# Patient Record
Sex: Female | Born: 1995 | Marital: Single | Smoking: Never smoker
Health system: Southern US, Community
[De-identification: ages and names within clinical notes are randomized; demographics above are authoritative.]

## PROBLEM LIST (undated history)

## (undated) DIAGNOSIS — R569 Unspecified convulsions: Secondary | ICD-10-CM

## (undated) HISTORY — DX: Unspecified convulsions: R56.9

---

## 2019-12-18 ENCOUNTER — Ambulatory Visit: Payer: Medicaid Other

## 2019-12-18 ENCOUNTER — Telehealth: Payer: Self-pay

## 2019-12-18 NOTE — Telephone Encounter (Signed)
VM full; Unable to reach pt for NOB virtual nurse visit

## 2019-12-26 DIAGNOSIS — O0990 Supervision of high risk pregnancy, unspecified, unspecified trimester: Secondary | ICD-10-CM | POA: Insufficient documentation

## 2019-12-27 ENCOUNTER — Other Ambulatory Visit (HOSPITAL_COMMUNITY)
Admission: RE | Admit: 2019-12-27 | Discharge: 2019-12-27 | Disposition: A | Discharge status: Home / Self Care | Payer: Medicaid Other | Source: Ambulatory Visit | Attending: Obstetrics | Admitting: Obstetrics

## 2019-12-27 ENCOUNTER — Other Ambulatory Visit: Payer: Self-pay | Admitting: Obstetrics

## 2019-12-27 ENCOUNTER — Encounter: Payer: Self-pay | Admitting: Obstetrics

## 2019-12-27 ENCOUNTER — Other Ambulatory Visit: Payer: Self-pay

## 2019-12-27 ENCOUNTER — Ambulatory Visit (INDEPENDENT_AMBULATORY_CARE_PROVIDER_SITE_OTHER): Payer: Medicaid Other | Admitting: Obstetrics

## 2019-12-27 VITALS — BP 132/76 | Ht 66.0 in | Wt 131.0 lb

## 2019-12-27 DIAGNOSIS — Z3492 Encounter for supervision of normal pregnancy, unspecified, second trimester: Secondary | ICD-10-CM

## 2019-12-27 DIAGNOSIS — Z3490 Encounter for supervision of normal pregnancy, unspecified, unspecified trimester: Secondary | ICD-10-CM

## 2019-12-27 DIAGNOSIS — Z3A13 13 weeks gestation of pregnancy: Secondary | ICD-10-CM | POA: Diagnosis not present

## 2019-12-27 MED ORDER — BLOOD PRESSURE KIT DEVI: 1.0000 | 0 refills | Status: DC

## 2019-12-27 MED ORDER — PRENATE PIXIE 10-0.6-0.4-200 MG PO CAPS: 1.0000 | ORAL_CAPSULE | Freq: Every day | ORAL | 3 refills | Status: DC

## 2019-12-27 NOTE — Addendum Note (Signed)
Addended by: Brock Bad on: 12/27/2019 03:18 PM   Modules accepted: Orders

## 2019-12-27 NOTE — Progress Notes (Signed)
Subjective:    Catherine Luna is being seen today for her first obstetrical visit.  This is not a planned pregnancy. She is at [redacted]w[redacted]d gestation. Her obstetrical history is significant for none. Relationship with FOB: significant other, not living together. Patient does intend to breast feed. Pregnancy history fully reviewed.  The information documented in the HPI was reviewed and verified.  Menstrual History: OB History    Gravida  1   Para  0   Term      Preterm      AB      Living        SAB      IAB      Ectopic      Multiple      Live Births               Patient's last menstrual period was 09/21/2019.    Past Medical History:  Diagnosis Date  . Seizures ([place])     History reviewed. No pertinent surgical history.  (Not in a hospital admission)  No Known Allergies  Social History   Tobacco Use  . Smoking status: Never Smoker  . Smokeless tobacco: Never Used  Substance Use Topics  . Alcohol use: Not Currently    Family History  Problem Relation Age of Onset  . Alcohol abuse Mother   . Heart disease Mother   . Hypertension Mother   . Anxiety disorder Sister   . Depression Sister   . Asthma Brother   . Heart disease Maternal Grandfather      Review of Systems Constitutional: negative for weight loss Gastrointestinal: negative for vomiting Genitourinary:negative for genital lesions and vaginal discharge and dysuria Musculoskeletal:negative for back pain Behavioral/Psych: negative for abusive relationship, depression, illegal drug usage and tobacco use    Objective:    BP 132/76   Ht $R'5\' 6"'ZH$  (1.676 m)   Wt 131 lb (59.4 kg)   LMP 09/21/2019   BMI 21.14 kg/m  General Appearance:    Alert, cooperative, no distress, appears stated age  Head:    Normocephalic, without obvious abnormality, atraumatic  Eyes:    PERRL, conjunctiva/corneas clear, EOM's intact, fundi    benign, both eyes  Ears:    Normal TM's and external ear canals, both ears   Nose:   Nares normal, septum midline, mucosa normal, no drainage    or sinus tenderness  Throat:   Lips, mucosa, and tongue normal; teeth and gums normal  Neck:   Supple, symmetrical, trachea midline, no adenopathy;    thyroid:  no enlargement/tenderness/nodules; no carotid   bruit or JVD  Back:     Symmetric, no curvature, ROM normal, no CVA tenderness  Lungs:     Clear to auscultation bilaterally, respirations unlabored  Chest Wall:    No tenderness or deformity   Heart:    Regular rate and rhythm, S1 and S2 normal, no murmur, rub   or gallop  Breast Exam:    No tenderness, masses, or nipple abnormality  Abdomen:     Soft, non-tender, bowel sounds active all four quadrants,    no masses, no organomegaly  Genitalia:    Normal female without lesion, discharge or tenderness  Extremities:   Extremities normal, atraumatic, no cyanosis or edema  Pulses:   2+ and symmetric all extremities  Skin:   Skin color, texture, turgor normal, no rashes or lesions  Lymph nodes:   Cervical, supraclavicular, and axillary nodes normal  Neurologic:   CNII-XII intact,  normal strength, sensation and reflexes    throughout      Lab Review Urine pregnancy test Labs reviewed yes Radiologic studies reviewed yes  Assessment:    Pregnancy at [redacted]w[redacted]d weeks    Plan:     1. Encounter for supervision of normal pregnancy, antepartum, unspecified gravidity Rx: - Blood Pressure Monitoring (BLOOD PRESSURE KIT) DEVI; 1 kit by Does not apply route once a week. Check Blood Pressure regularly and record readings into the Babyscripts App.  Large Cuff.  DX O90.0  Dispense: 1 each; Refill: 0 - Enroll Patient in Babyscripts - Babyscripts Schedule Optimization - Culture, OB Urine - Genetic Screening - CBC/D/Plt+RPR+Rh+ABO+Rub Ab... - Cytology - PAP( [place]) - Cervicovaginal ancillary only( [place]) - Flu Vaccine QUAD 36+ mos IM (Fluarix, Quad PF) - [place] MFM OB COMP + 14 WK; Future   Prenatal vitamins.   Counseling provided regarding continued use of seat belts, cessation of alcohol consumption, smoking or use of illicit drugs; infection precautions i.e., influenza/TDAP immunizations, toxoplasmosis,CMV, parvovirus, listeria and varicella; workplace safety, exercise during pregnancy; routine dental care, safe medications, sexual activity, hot tubs, saunas, pools, travel, caffeine use, fish and methlymercury, potential toxins, hair treatments, varicose veins Weight gain recommendations per IOM guidelines reviewed: underweight/BMI< 18.5--> gain 28 - 40 lbs; normal weight/BMI 18.5 - 24.9--> gain 25 - 35 lbs; overweight/BMI 25 - 29.9--> gain 15 - 25 lbs; obese/BMI >30->gain  11 - 20 lbs Problem list reviewed and updated. FIRST/CF mutation testing/NIPT/QUAD SCREEN/fragile X/Ashkenazi Jewish population testing/Spinal muscular atrophy discussed: requested. Role of ultrasound in pregnancy discussed; fetal survey: requested. Amniocentesis discussed: not indicated.  Meds ordered this encounter  Medications  . Blood Pressure Monitoring (BLOOD PRESSURE KIT) DEVI    Sig: 1 kit by Does not apply route once a week. Check Blood Pressure regularly and record readings into the Babyscripts App.  Large Cuff.  DX O90.0    Dispense:  1 each    Refill:  0   Orders Placed This Encounter  Procedures  . Culture, OB Urine  . [place] MFM OB COMP + 14 WK    Standing Status:   Future    Standing Expiration Date:   12/25/2020    Order Specific Question:   Reason for Exam (SYMPTOM  OR DIAGNOSIS REQUIRED)    Answer:   Anatomy    Order Specific Question:   Preferred Location    Answer:   [facility] Ultrasound  . Genetic Screening  . CBC/D/Plt+RPR+Rh+ABO+Rub Ab...    Follow up in 4 weeks. 50% of 20 min visit spent on counseling and coordination of care.    Shelly Bombard, MD 12/27/2019 2:55 PM

## 2019-12-27 NOTE — Patient Instructions (Signed)
Heartburn During Pregnancy Heartburn is a type of pain or discomfort that can happen in the throat or chest. It is often described as a burning sensation. Heartburn is common during pregnancy because:  A hormone (progesterone) that is released during pregnancy may relax the valve (lower esophageal sphincter, or LES) that separates the esophagus from the stomach. This allows stomach acid to move up into the esophagus, causing heartburn.  The uterus gets larger and pushes up on the stomach, which pushes more acid into the esophagus. This is especially true in the later stages of pregnancy. Heartburn usually goes away or gets better after giving birth. What are the causes? Heartburn is caused by stomach acid backing up into the esophagus (reflux). Reflux can be triggered by:  Changing hormone levels.  Large meals.  Certain foods and beverages, such as coffee, chocolate, onions, and peppermint.  Exercise.  Increased stomach acid production. What increases the risk? You are more likely to experience heartburn during pregnancy if you:  Had heartburn prior to becoming pregnant.  Have been pregnant more than once before.  Are overweight or obese. The likelihood that you will get heartburn also increases as you get farther along in your pregnancy, especially during the last trimester. What are the signs or symptoms? Symptoms of this condition include:  Burning pain in the chest or lower throat.  Bitter taste in the mouth.  Coughing.  Problems swallowing.  Vomiting.  Hoarse voice.  Asthma. Symptoms may get worse when you lie down or bend over. Symptoms are often worse at night. How is this diagnosed? This condition is diagnosed based on:  Your medical history.  Your symptoms.  Blood tests to check for a certain type of bacteria associated with heartburn.  Whether taking heartburn medicine relieves your symptoms.  Examination of the stomach and esophagus using a tube with  a light and camera on the end (endoscopy). How is this treated? Treatment varies depending on how severe your symptoms are. Your health care provider may recommend:  Over-the-counter medicines (antacids or acid reducers) for mild heartburn.  Prescription medicines to decrease stomach acid or to protect your stomach lining.  Certain changes in your diet.  Raising the head of your bed so it is higher than the foot of the bed. This helps prevent stomach acid from backing up into the esophagus when you are lying down. Follow these instructions at home: Eating and drinking  Do not drink alcohol during your pregnancy.  Identify foods and beverages that make your symptoms worse, and avoid them.  Beverages that you may want to avoid include: ? Coffee and tea (with or without caffeine). ? Energy drinks and sports drinks. ? Carbonated drinks or sodas. ? Citrus fruit juices.  Foods that you may want to avoid include: ? Chocolate and cocoa. ? Peppermint and mint flavorings. ? Garlic, onions, and horseradish. ? Spicy and acidic foods, including peppers, chili powder, curry powder, vinegar, hot sauces, and barbecue sauce. ? Citrus fruits, such as oranges, lemons, and limes. ? Tomato-based foods, such as red sauce, chili, and salsa. ? Fried and fatty foods, such as donuts, french fries, potato chips, and high-fat dressings. ? High-fat meats, such as hot dogs, cold cuts, sausage, ham, and bacon. ? High-fat dairy items, such as whole milk, butter, and cheese.  Eat small, frequent meals instead of large meals.  Avoid drinking large amounts of liquid with your meals.  Avoid eating meals during the 2-3 hours before bedtime.  Avoid lying down right   after you eat.  Do not exercise right after you eat. Medicines  Take over-the-counter and prescription medicines only as told by your health care provider.  Do not take aspirin, ibuprofen, or other NSAIDs unless your health care provider tells  you to do that.  You may be instructed to avoid medicines that contain sodium bicarbonate. General instructions   If directed, raise the head of your bed about 6 inches (15 cm) by putting blocks under the legs. Sleeping with more pillows does not effectively relieve heartburn because it only changes the position of your head.  Do not use any products that contain nicotine or tobacco, such as cigarettes and e-cigarettes. If you need help quitting, ask your health care provider.  Wear loose-fitting clothing.  Try to reduce your stress, such as with yoga or meditation. If you need help managing stress, ask your health care provider.  Maintain a healthy weight. If you are overweight, work with your health care provider to safely lose weight.  Keep all follow-up visits as told by your health care provider. This is important. Contact a health care provider if:  You develop new symptoms.  Your symptoms do not improve with treatment.  You have unexplained weight loss.  You have difficulty swallowing.  You make loud sounds when you breathe (wheeze).  You have a cough that does not go away.  You have frequent heartburn for more than 2 weeks.  You have nausea or vomiting that does not get better with treatment.  You have pain in your abdomen. Get help right away if:  You have severe chest pain that spreads to your arm, neck, or jaw.  You feel sweaty, dizzy, or light-headed.  You have shortness of breath.  You have pain when swallowing.  You vomit, and your vomit looks like blood or coffee grounds.  Your stool is bloody or black. This information is not intended to replace advice given to you by your health care provider. Make sure you discuss any questions you have with your health care provider. Document Revised: 03/21/2017 Document Reviewed: 09/08/2015 Elsevier Patient Education  2020 Elsevier Inc.  

## 2019-12-28 LAB — CBC/D/PLT+RPR+RH+ABO+RUB AB...
Antibody Screen: NEGATIVE
Basophils Absolute: 0.1 10*3/uL (ref 0.0–0.2)
Basos: 1 %
EOS (ABSOLUTE): 0 10*3/uL (ref 0.0–0.4)
Eos: 0 %
HCV Ab: <0.1 s/co ratio (ref 0.0–0.9)
HIV Screen 4th Generation wRfx: NONREACTIVE
Hematocrit: 39.6 % (ref 34.0–46.6)
Hemoglobin: 13.2 g/dL (ref 11.1–15.9)
Hepatitis B Surface Ag: NEGATIVE
Immature Grans (Abs): 0 10*3/uL (ref 0.0–0.1)
Immature Granulocytes: 0 %
Lymphocytes Absolute: 1.7 10*3/uL (ref 0.7–3.1)
Lymphs: 13 %
MCH: 30.1 pg (ref 26.6–33.0)
MCHC: 33.3 g/dL (ref 31.5–35.7)
MCV: 90 fL (ref 79–97)
Monocytes Absolute: 0.7 10*3/uL (ref 0.1–0.9)
Monocytes: 6 %
Neutrophils Absolute: 10.3 10*3/uL — ABNORMAL HIGH (ref 1.4–7.0)
Neutrophils: 80 %
Platelets: 280 10*3/uL (ref 150–450)
RBC: 4.39 x10E6/uL (ref 3.77–5.28)
RDW: 12.6 % (ref 11.7–15.4)
RPR Ser Ql: NONREACTIVE
Rh Factor: POSITIVE
Rubella Antibodies, IGG: 1.6 index (ref >0.99)
WBC: 12.8 10*3/uL — ABNORMAL HIGH (ref 3.4–10.8)

## 2019-12-28 LAB — HCV INTERPRETATION

## 2019-12-29 LAB — URINE CULTURE, OB REFLEX

## 2019-12-29 LAB — CULTURE, OB URINE

## 2019-12-31 LAB — CERVICOVAGINAL ANCILLARY ONLY
Bacterial Vaginitis (gardnerella): POSITIVE — AB
Candida Glabrata: NEGATIVE
Candida Vaginitis: POSITIVE — AB
Chlamydia: NEGATIVE
Comment: NEGATIVE
Comment: NEGATIVE
Comment: NEGATIVE
Comment: NEGATIVE
Comment: NEGATIVE
Comment: NORMAL
Neisseria Gonorrhea: NEGATIVE
Trichomonas: NEGATIVE

## 2020-01-01 ENCOUNTER — Other Ambulatory Visit: Payer: Self-pay | Admitting: Obstetrics

## 2020-01-01 DIAGNOSIS — Z3490 Encounter for supervision of normal pregnancy, unspecified, unspecified trimester: Secondary | ICD-10-CM

## 2020-01-01 DIAGNOSIS — B3731 Acute candidiasis of vulva and vagina: Secondary | ICD-10-CM

## 2020-01-01 DIAGNOSIS — N760 Acute vaginitis: Secondary | ICD-10-CM

## 2020-01-01 MED ORDER — TERCONAZOLE 0.4 % VA CREA: 1.0000 | TOPICAL_CREAM | Freq: Every day | VAGINAL | 0 refills | Status: DC

## 2020-01-01 MED ORDER — METRONIDAZOLE 500 MG PO TABS
500.0000 mg | ORAL_TABLET | Freq: Two times a day (BID) | ORAL | 2 refills | Status: DC
Start: 2020-01-01 — End: 2020-06-13

## 2020-01-05 NOTE — L&D Delivery Note (Signed)
OB/GYN Faculty Practice Delivery Note  Catherine Luna is a 25 y.o. G2P0010 s/p vag del at [redacted]w[redacted]d. She was admitted for induction due to FGR, with gHTN also diagnosed at admission.   ROM: 2h 56m with clear fluid GBS Status: pos Maximum Maternal Temperature: 98.3  Labor Progress: Catherine Luna was admitted for IOL due to FGR which was diagnosed on 6/3, however she was initially declined IOL. EFW was 2.8% with AC<1st%. She was also dx with gHTN upon arrival to [facility] (neg pre-e labs and asymptomatic). She received cytotec x 3 doses, had SROM, and then spont progression to complete with pushing x .  Delivery Date/Time: June 8th, 2022 at 2355 Delivery: Called to room and patient was complete and pushing. Head delivered LOA. No nuchal cord present. Shoulder and body delivered in usual fashion. Infant with spontaneous cry, placed on mother's abdomen, dried and stimulated. Cord clamped x 2 after 1-minute delay, and cut by FOB. Cord blood drawn. Placenta delivered spontaneously with gentle cord traction. Fundus firm with massage and Pitocin. Labia, perineum, vagina, and cervix inspected and found to have a R labial lac repaired in the usual fashion with 1% lidocaine and 3-0 monocryl.   Placenta: spont, intact, to L&D Complications: none Lacerations: R labial EBL: 150cc Analgesia: 1% lidocaine  Postpartum Planning [x]  message to sent to schedule follow-up    Infant: girl  APGARs 8/9  2546g (5lb 9.8oz)  10/9, CNM  06/12/2020 12:41 AM

## 2020-01-06 LAB — CYTOLOGY - PAP
Comment: NEGATIVE
Comment: NEGATIVE
Diagnosis: UNDETERMINED — AB
HPV 16: NEGATIVE
HPV 18 / 45: NEGATIVE
High risk HPV: POSITIVE — AB

## 2020-01-07 ENCOUNTER — Other Ambulatory Visit: Payer: Self-pay | Admitting: Obstetrics

## 2020-01-07 ENCOUNTER — Encounter: Payer: Self-pay | Admitting: Obstetrics

## 2020-01-08 ENCOUNTER — Encounter: Payer: Self-pay | Admitting: Obstetrics

## 2020-01-24 ENCOUNTER — Other Ambulatory Visit: Payer: Self-pay

## 2020-01-24 ENCOUNTER — Ambulatory Visit (INDEPENDENT_AMBULATORY_CARE_PROVIDER_SITE_OTHER): Payer: Medicaid Other

## 2020-01-24 VITALS — BP 104/59 | HR 73 | Wt 133.0 lb

## 2020-01-24 DIAGNOSIS — R87610 Atypical squamous cells of undetermined significance on cytologic smear of cervix (ASC-US): Secondary | ICD-10-CM

## 2020-01-24 DIAGNOSIS — R87810 Cervical high risk human papillomavirus (HPV) DNA test positive: Secondary | ICD-10-CM

## 2020-01-24 DIAGNOSIS — Z3A17 17 weeks gestation of pregnancy: Secondary | ICD-10-CM

## 2020-01-24 DIAGNOSIS — Z3490 Encounter for supervision of normal pregnancy, unspecified, unspecified trimester: Secondary | ICD-10-CM

## 2020-01-24 DIAGNOSIS — F17200 Nicotine dependence, unspecified, uncomplicated: Secondary | ICD-10-CM

## 2020-01-24 NOTE — Progress Notes (Signed)
ROB/AFP, reports no problems today.

## 2020-01-24 NOTE — Progress Notes (Signed)
   LOW-RISK PREGNANCY OFFICE VISIT  Patient name: Catherine Luna MRN [id number]  Date of birth: 11/15/1995 Chief Complaint:   Routine Prenatal Visit  Subjective:   Catherine Luna is a 25 y.o. G1P0 female at [redacted]w[redacted]d with an Estimated Date of Delivery: 06/27/20 being seen today for ongoing management of a low-risk pregnancy aeb has Supervision of normal pregnancy, antepartum on their problem list.  Patient presents today with complaint of SOB and abdominal cramping.  She states when she is up and active she feels like she can't catch her breathe.  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  She reports symptoms resolve after rest.  She reports cramping is noticed with sitting usually late at night or early in the morning.  She reports "I probably should drink more" when asked about water intake. Patient denies vaginal concerns including abnormal discharge, leaking of fluid, and bleeding.  Contractions: Not present. Vag. Bleeding: None.   .  Reviewed past medical,surgical, social, obstetrical and family history as well as problem list, medications and allergies.  Objective   Vitals:   01/24/20 1004  BP: (!) 104/59  Pulse: 73  Weight: 133 lb (60.3 kg)  Body mass index is 21.47 kg/m.  Total Weight Gain:3 lb (1.361 kg)         Physical Examination:   General appearance: Well appearing, and in no distress  Mental status: Alert, oriented to person, place, and time  Skin: Warm & dry  Cardiovascular: Normal heart rate noted  Respiratory: Normal respiratory effort, no distress  Abdomen: Fundal height  Not assessed  Pelvic: Cervical exam deferred           Extremities: Edema: None  Fetal Status: Fetal Heart Rate (bpm): 146      No results found for this or any previous visit (from the past 24 hour(s)).  Assessment & Plan:  Low-risk pregnancy of a 25 y.o., G1P0 at [redacted]w[redacted]d with an Estimated Date of Delivery: 06/27/20   1. Encounter for supervision  of normal pregnancy, antepartum, unspecified gravidity -Anticipatory guidance for upcoming appts. -Patient to next appt in 4 weeks as an in person visit. -Reviewed labs from initial visit. -Encouraged to activate mychart for reviewing of labs, appts, and communication with office.  2. [redacted] weeks gestation of pregnancy -Doing well overall/ -AFP today.  Discussed what it is and what results mean. -Informed that next appt for [place] on Jan 24th.   3. ASCUS with positive high risk HPV cervical -Informed of pap smear results. -Discussed recommendation for repeat pap in 1 one. -Educated on abnormal cell growth and how HPV  Negative for most common strains of 16, 18, and 45. -Educated on how smoking cessation could reduce abnormal cell growth.  4. Current smoker -Has cut back with pregnancy. -Desires cessation. -Information to be given.     Meds: No orders of the defined types were placed in this encounter.  Labs/procedures today:  Lab Orders  No laboratory test(s) ordered today     Reviewed: Preterm labor symptoms and general obstetric precautions including but not limited to vaginal bleeding, contractions, leaking of fluid and fetal movement were reviewed in detail with the patient.  All questions were answered.  Follow-up: No follow-ups on file.  No orders of the defined types were placed in this encounter.  Cherre Robins MSN, CNM 01/24/2020

## 2020-01-24 NOTE — Patient Instructions (Signed)

## 2020-01-27 LAB — AFP, SERUM, OPEN SPINA BIFIDA
AFP MoM: 1.01
AFP Value: 45.5 ng/mL
Gest. Age on Collection Date: 17.6 weeks
Maternal Age At EDD: 24.8 yr
OSBR Risk 1 IN: 10000
Test Results:: NEGATIVE
Weight: 133 [lb_av]

## 2020-01-28 ENCOUNTER — Ambulatory Visit: Payer: Medicaid Other | Admitting: *Deleted

## 2020-01-28 ENCOUNTER — Telehealth: Payer: Self-pay | Admitting: *Deleted

## 2020-01-28 ENCOUNTER — Other Ambulatory Visit (HOSPITAL_COMMUNITY): Payer: Self-pay | Admitting: Obstetrics and Gynecology

## 2020-01-28 ENCOUNTER — Encounter: Payer: Self-pay | Admitting: *Deleted

## 2020-01-28 ENCOUNTER — Other Ambulatory Visit: Payer: Self-pay

## 2020-01-28 ENCOUNTER — Other Ambulatory Visit: Payer: Self-pay | Admitting: *Deleted

## 2020-01-28 ENCOUNTER — Ambulatory Visit: Payer: Medicaid Other | Attending: Obstetrics and Gynecology

## 2020-01-28 VITALS — BP 120/59 | HR 65

## 2020-01-28 DIAGNOSIS — F1210 Cannabis abuse, uncomplicated: Secondary | ICD-10-CM

## 2020-01-28 DIAGNOSIS — Z3A18 18 weeks gestation of pregnancy: Secondary | ICD-10-CM | POA: Diagnosis not present

## 2020-01-28 DIAGNOSIS — O99332 Smoking (tobacco) complicating pregnancy, second trimester: Secondary | ICD-10-CM

## 2020-01-28 DIAGNOSIS — Z3490 Encounter for supervision of normal pregnancy, unspecified, unspecified trimester: Secondary | ICD-10-CM | POA: Insufficient documentation

## 2020-01-28 DIAGNOSIS — F17210 Nicotine dependence, cigarettes, uncomplicated: Secondary | ICD-10-CM

## 2020-01-28 DIAGNOSIS — Z362 Encounter for other antenatal screening follow-up: Secondary | ICD-10-CM | POA: Diagnosis not present

## 2020-01-28 NOTE — Telephone Encounter (Signed)
Pt called with questions about Mychart medication list and BV Rx. Discussed with pt and answered questions.

## 2020-02-21 ENCOUNTER — Other Ambulatory Visit: Payer: Self-pay

## 2020-02-21 ENCOUNTER — Ambulatory Visit (INDEPENDENT_AMBULATORY_CARE_PROVIDER_SITE_OTHER): Payer: Medicaid Other | Admitting: Obstetrics and Gynecology

## 2020-02-21 DIAGNOSIS — Z3490 Encounter for supervision of normal pregnancy, unspecified, unspecified trimester: Secondary | ICD-10-CM

## 2020-02-21 MED ORDER — BLOOD PRESSURE KIT DEVI: 1.0000 | 0 refills | Status: DC

## 2020-02-21 MED ORDER — PRENATE PIXIE 10-0.6-0.4-200 MG PO CAPS
1.0000 | ORAL_CAPSULE | Freq: Every day | ORAL | 3 refills | Status: DC
Start: 2020-02-21 — End: 2020-02-28

## 2020-02-21 MED ORDER — BLOOD PRESSURE KIT DEVI: 1.0000 | 0 refills | Status: AC

## 2020-02-21 NOTE — Progress Notes (Signed)
PRENATAL VISIT NOTE  Subjective:  Catherine Luna is a 25 y.o. G2P0010 at 68w6dbeing seen today for ongoing prenatal care.  She is currently monitored for the following issues for this low-risk pregnancy and has Supervision of normal pregnancy, antepartum; ASCUS with positive high risk HPV cervical; and Current smoker on their problem list.  Patient reports no complaints.  Contractions: Not present. Vag. Bleeding: None.  Movement: Present. Denies leaking of fluid.   The following portions of the patient's history were reviewed and updated as appropriate: allergies, current medications, past family history, past medical history, past social history, past surgical history and problem list.   Objective:   Vitals:   02/21/20 0956  BP: 114/67  Pulse: 83  Weight: 138 lb (62.6 kg)    Fetal Status: Fetal Heart Rate (bpm): 140 Fundal Height: 21 cm Movement: Present     General:  Alert, oriented and cooperative. Patient is in no acute distress.  Skin: Skin is warm and dry. No rash noted.   Cardiovascular: Normal heart rate noted  Respiratory: Normal respiratory effort, no problems with respiration noted  Abdomen: Soft, gravid, appropriate for gestational age.  Pain/Pressure: Absent     Pelvic: Cervical exam deferred        Extremities: Normal range of motion.  Edema: None  Mental Status: Normal mood and affect. Normal behavior. Normal judgment and thought content.   Assessment and Plan:  Pregnancy: G2P0010 at 260w6d. Encounter for supervision of normal pregnancy, antepartum, unspecified gravidity  - Blood Pressure Monitoring (BLOOD PRESSURE KIT) DEVI; 1 kit by Does not apply route once a week. Check Blood Pressure regularly and record readings into the Babyscripts App.  Large Cuff.  DX O90.0  Dispense: 1 each; Refill: 0  - MFM USKoreacheduled   Preterm labor symptoms and general obstetric precautions including but not limited to vaginal bleeding, contractions, leaking of fluid and  fetal movement were reviewed in detail with the patient. Please refer to After Visit Summary for other counseling recommendations.   Return in about 4 weeks (around 03/20/2020).  Future Appointments  Date Time Provider [place]2/21/2022  1:00 [facility]  1:15 PM [facility]  9:55 AM EmGavin PoundCNM [facility] None    JeNoni SaupeNP

## 2020-02-21 NOTE — Patient Instructions (Signed)

## 2020-02-25 ENCOUNTER — Ambulatory Visit: Payer: Medicaid Other

## 2020-02-25 ENCOUNTER — Telehealth: Payer: Self-pay

## 2020-02-25 NOTE — Telephone Encounter (Signed)
Called pt to reschedule her appointment this afternoon due to staffing availablity. Her voicemail was full and I was unable to leave a message. Rescheduled appointment to 02/26/2020 at 3:45pm.   I sent her all of this information via MyChart.

## 2020-02-26 ENCOUNTER — Ambulatory Visit: Payer: Medicaid Other

## 2020-02-28 ENCOUNTER — Other Ambulatory Visit: Payer: Self-pay

## 2020-02-28 MED ORDER — PRENATE MINI 18-0.6-0.4-350 MG PO CAPS: 1.0000 | ORAL_CAPSULE | Freq: Every day | ORAL | 12 refills | Status: AC

## 2020-02-28 NOTE — Progress Notes (Signed)
Received phone call from [facility] Pt's insurance will not cover Prenatal Pixie They will cover Prenatal Mini  Pt notified

## 2020-03-06 ENCOUNTER — Other Ambulatory Visit: Payer: Self-pay

## 2020-03-06 ENCOUNTER — Ambulatory Visit (HOSPITAL_BASED_OUTPATIENT_CLINIC_OR_DEPARTMENT_OTHER): Payer: Medicaid Other

## 2020-03-06 ENCOUNTER — Other Ambulatory Visit: Payer: Self-pay | Admitting: Obstetrics and Gynecology

## 2020-03-06 ENCOUNTER — Encounter: Payer: Self-pay | Admitting: *Deleted

## 2020-03-06 ENCOUNTER — Ambulatory Visit: Payer: Medicaid Other | Attending: Obstetrics and Gynecology | Admitting: *Deleted

## 2020-03-06 VITALS — BP 116/57 | HR 71

## 2020-03-06 DIAGNOSIS — Z362 Encounter for other antenatal screening follow-up: Secondary | ICD-10-CM | POA: Diagnosis present

## 2020-03-06 DIAGNOSIS — Z3A23 23 weeks gestation of pregnancy: Secondary | ICD-10-CM | POA: Insufficient documentation

## 2020-03-06 DIAGNOSIS — F17200 Nicotine dependence, unspecified, uncomplicated: Secondary | ICD-10-CM | POA: Diagnosis not present

## 2020-03-06 DIAGNOSIS — O99332 Smoking (tobacco) complicating pregnancy, second trimester: Secondary | ICD-10-CM | POA: Diagnosis not present

## 2020-03-06 DIAGNOSIS — F17290 Nicotine dependence, other tobacco product, uncomplicated: Secondary | ICD-10-CM

## 2020-03-07 ENCOUNTER — Other Ambulatory Visit: Payer: Self-pay | Admitting: Obstetrics and Gynecology

## 2020-03-07 DIAGNOSIS — O365921 Maternal care for other known or suspected poor fetal growth, second trimester, fetus 1: Secondary | ICD-10-CM

## 2020-03-07 DIAGNOSIS — O99332 Smoking (tobacco) complicating pregnancy, second trimester: Secondary | ICD-10-CM

## 2020-03-20 ENCOUNTER — Ambulatory Visit: Payer: Medicaid Other

## 2020-03-20 ENCOUNTER — Ambulatory Visit (INDEPENDENT_AMBULATORY_CARE_PROVIDER_SITE_OTHER): Payer: Medicaid Other

## 2020-03-20 ENCOUNTER — Other Ambulatory Visit (HOSPITAL_COMMUNITY)
Admission: RE | Admit: 2020-03-20 | Discharge: 2020-03-20 | Disposition: A | Discharge status: Home / Self Care | Payer: Medicaid Other | Source: Ambulatory Visit

## 2020-03-20 ENCOUNTER — Other Ambulatory Visit: Payer: Self-pay

## 2020-03-20 ENCOUNTER — Other Ambulatory Visit: Payer: Medicaid Other

## 2020-03-20 VITALS — BP 119/65 | HR 67 | Wt 141.0 lb

## 2020-03-20 DIAGNOSIS — N760 Acute vaginitis: Secondary | ICD-10-CM | POA: Insufficient documentation

## 2020-03-20 DIAGNOSIS — Z3A25 25 weeks gestation of pregnancy: Secondary | ICD-10-CM

## 2020-03-20 DIAGNOSIS — B373 Candidiasis of vulva and vagina: Secondary | ICD-10-CM

## 2020-03-20 DIAGNOSIS — Z3490 Encounter for supervision of normal pregnancy, unspecified, unspecified trimester: Secondary | ICD-10-CM

## 2020-03-20 DIAGNOSIS — B3731 Acute candidiasis of vulva and vagina: Secondary | ICD-10-CM

## 2020-03-20 DIAGNOSIS — N898 Other specified noninflammatory disorders of vagina: Secondary | ICD-10-CM

## 2020-03-20 NOTE — Progress Notes (Signed)
LOW-RISK PREGNANCY OFFICE VISIT  Patient name: Catherine Luna MRN [id number]  Date of birth: 10/21/1995 Chief Complaint:   Routine Prenatal Visit  Subjective:   Catherine Luna is a 25 y.o. G27P0010 female at [redacted]w[redacted]d with an Estimated Date of Delivery: 06/27/20 being seen today for ongoing management of a low-risk pregnancy aeb has Supervision of normal pregnancy, antepartum; ASCUS with positive high risk HPV cervical; and Current smoker on their problem list.  Patient presents today with complaint of vaginal itching. Patient reports she has been experiencing her symptoms for the past week.  She states she has sensitive skin and "has been trying out new baby wipes" in preparation for her baby.  She states she has not treated her symptoms, but has found some improvement with switching baby wipes.   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  Patient reports drinking 2-24 ounce bottles of water daily.   Contractions: Not present. Vag. Bleeding: None.  Movement: Present.  Reviewed past medical,surgical, social, obstetrical and family history as well as problem list, medications and allergies.  Objective   Vitals:   03/20/20 1017  BP: 119/65  Pulse: 67  Weight: 141 lb (64 kg)  Body mass index is 22.76 kg/m.  Total Weight Gain:11 lb (4.99 kg)         Physical Examination:   General appearance: Well appearing, and in no distress  Mental status: Alert, oriented to person, place, and time  Skin: Warm & dry  Cardiovascular: Normal heart rate noted  Respiratory: Normal respiratory effort, no distress  Abdomen: Soft, gravid, nontender, AGA with Fundal height of Fundal Height: 26 cm  Pelvic: Cervical exam deferred   Speculum Exam:  NEFG, No apparent discharge or irritation/erythema.  Vaginal vault with moderate amt thin white discharge. CV collected  Extremities: Edema: Trace  Fetal Status:    Movement: Present   No results found for this or any previous visit (from the past 24 hour(s)).  Assessment & Plan:  Low-risk pregnancy of a 25 y.o., G2P0010 at [redacted]w[redacted]d with an Estimated Date of Delivery: 06/27/20   1. Encounter for supervision of normal pregnancy, antepartum, unspecified gravidity -Anticipatory guidance for upcoming appts. -Patient to next appt in 3 weeks for an in-person visit. -Reviewed Glucola appt preparation including fasting the night before and morning of.   -Discussed anticipated office time of 2.5-3 hours.  -Reviewed blood draw procedures and labs which also include check of iron level.  -Discussed how results of GTT are handled including diabetic education and BS testing for abnormal results and routine care for normal results.   2. Vaginal Itching -Informed that vaginal itching could be from baby wipe usage.  Encouraged to discontinue. -Reviewed speculum exam findings and informed not definitive for yeast so would wait results to treat. -Extensive discussion how high sugar content in nutritional intake can cause recurrent yeast infections. -Encouraged to substitute sugars, carbs, and some fruits for veggies and protein.  -FOB, Catherine Luna, reassured that he does not require testing or treatment for yeast.  3. [redacted] weeks gestation of pregnancy -Complaints addressed. -Encouraged to drink more water especially when working all day. -Discussed water intake of at least 65 ounces daily based on current weight.    Meds: No orders of the defined types were  placed in this encounter.  Labs/procedures today:  Lab Orders  No laboratory test(s) ordered today     Reviewed: Preterm labor symptoms and general obstetric precautions including but not limited to vaginal bleeding, contractions, leaking of fluid and  fetal movement were reviewed in detail with the patient.  All questions were answered.  Follow-up: Return in about 3 weeks (around 04/10/2020) for LROB with GTT.  No orders of the defined types were placed in this encounter.  Cherre Robins MSN, CNM 03/20/2020

## 2020-03-20 NOTE — Progress Notes (Signed)
ROB [redacted]w[redacted]d  CC: Patient notes vaginal itching. would like vaginal swab . no odor and no discharge.  Pt states she has been trying different baby wipes.    FHT : 145

## 2020-03-20 NOTE — Patient Instructions (Addendum)
Oral Glucose Tolerance Test During Pregnancy Why am I having this test? The oral glucose tolerance test (OGTT) is done to check how your body processes blood sugar (glucose). This is one of several tests used to diagnose diabetes that develops during pregnancy (gestational diabetes mellitus). Gestational diabetes is a short-term form of diabetes that some women develop while they are pregnant. It usually occurs during the second trimester of pregnancy and goes away after delivery. Testing, or screening, for gestational diabetes usually occurs at weeks 24-28 of pregnancy. You may have the OGTT test after having a 1-hour glucose screening test if the results from that test indicate that you may have gestational diabetes. This test may also be needed if:  You have a history of gestational diabetes.  There is a history of giving birth to very large babies or of losing pregnancies (having stillbirths).  You have signs and symptoms of diabetes, such as: ? Changes in your eyesight. ? Tingling or numbness in your hands or feet. ? Changes in hunger, thirst, and urination, and these are not explained by your pregnancy. What is being tested? This test measures the amount of glucose in your blood at different times during a period of 3 hours. This shows how well your body can process glucose. What kind of sample is taken? Blood samples are required for this test. They are usually collected by inserting a needle into a blood vessel.   How do I prepare for this test?  For 3 days before your test, eat normally. Have plenty of carbohydrate-rich foods.  Follow instructions from your health care provider about: ? Eating or drinking restrictions on the day of the test. You may be asked not to eat or drink anything other than water (to fast) starting 8-10 hours before the test. ? Changing or stopping your regular medicines. Some medicines may interfere with this test. Tell a health care provider about:  All  medicines you are taking, including vitamins, herbs, eye drops, creams, and over-the-counter medicines.  Any blood disorders you have.  Any surgeries you have had.  Any medical conditions you have. What happens during the test? First, your blood glucose will be measured. This is referred to as your fasting blood glucose because you fasted before the test. Then, you will drink a glucose solution that contains a certain amount of glucose. Your blood glucose will be measured again 1, 2, and 3 hours after you drink the solution. This test takes about 3 hours to complete. You will need to stay at the testing location during this time. During the testing period:  Do not eat or drink anything other than the glucose solution.  Do not exercise.  Do not use any products that contain nicotine or tobacco, such as cigarettes, e-cigarettes, and chewing tobacco. These can affect your test results. If you need help quitting, ask your health care provider. The testing procedure may vary among health care providers and hospitals. How are the results reported? Your results will be reported as milligrams of glucose per deciliter of blood (mg/dL) or millimoles per liter (mmol/L). There is more than one source for screening and diagnosis reference values used to diagnose gestational diabetes. Your health care provider will compare your results to normal values that were established after testing a large group of people (reference values). Reference values may vary among labs and hospitals. For this test (Carpenter-Coustan), reference values are:  Fasting: 95 mg/dL (5.3 mmol/L).  1 hour: 180 mg/dL (10.0 mmol/L).  2 hour:   155 mg/dL (8.6 mmol/L).  3 hour: 140 mg/dL (7.8 mmol/L). What do the results mean? Results below the reference values are considered normal. If two or more of your blood glucose levels are at or above the reference values, you may be diagnosed with gestational diabetes. If only one level is  high, your health care provider may suggest repeat testing or other tests to confirm a diagnosis. Talk with your health care provider about what your results mean. Questions to ask your health care provider Ask your health care provider, or the department that is doing the test:  When will my results be ready?  How will I get my results?  What are my treatment options?  What other tests do I need?  What are my next steps? Summary  The oral glucose tolerance test (OGTT) is one of several tests used to diagnose diabetes that develops during pregnancy (gestational diabetes mellitus). Gestational diabetes is a short-term form of diabetes that some women develop while they are pregnant.  You may have the OGTT test after having a 1-hour glucose screening test if the results from that test show that you may have gestational diabetes. You may also have this test if you have any symptoms or risk factors for this type of diabetes.  Talk with your health care provider about what your results mean. This information is not intended to replace advice given to you by your health care provider. Make sure you discuss any questions you have with your health care provider. Document Revised: 05/31/2019 Document Reviewed: 05/31/2019 Elsevier Patient Education  2021 Elsevier Inc. Vaginitis  Vaginitis is a condition in which the vaginal tissue swells and becomes irritated. This condition is most often caused by a change in the normal balance of bacteria and yeast that live in the vagina. This change causes an overgrowth of certain bacteria or yeast, which causes the inflammation. There are different types of vaginitis. What are the causes? The cause of this condition depends on the type of vaginitis. It can be caused by:  Bacteria (bacterial vaginosis).  Yeast, which is a fungus (candidiasis).  A parasite (trichomoniasis vaginitis).  A virus (viral vaginitis).  Low hormone levels (atrophic  vaginitis). Low hormone levels can occur during pregnancy, breastfeeding, or after menopause.  Irritants, such as bubble baths, scented tampons, and feminine sprays (allergic vaginitis). Other factors can change the normal balance of the yeast and bacteria that live in the vagina. These include:  Antibiotic medicines.  Poor hygiene.  Diaphragms, vaginal sponges, spermicides, birth control pills, and intrauterine devices (IUDs).  Sex.  Infection.  Uncontrolled diabetes.  A weakened body defense system (immune system). What increases the risk? This condition is more likely to develop in women who:  Smoke or are exposed to secondhand smoke.  Use vaginal douches, scented tampons, or scented sanitary pads.  Wear tight-fitting pants or thong underwear.  Use oral birth control pills or an IUD.  Have sex without a condom or have multiple partners.  Have an STI.  Frequently use the spermicide nonoxynol-9.  Eat lots of foods high in sugar or who have uncontrolled diabetes.  Have low estrogen levels.  Have a weakened immune system from an immune disorder or medical treatment.  Are pregnant or breastfeeding. What are the signs or symptoms? Symptoms vary depending on the cause of the vaginitis. Common symptoms include:  Abnormal vaginal discharge. ? The discharge is white, gray, or yellow with bacterial vaginosis. ? The discharge is thick, white, and cheesy with  a yeast infection. ? The discharge is frothy and yellow or greenish with trichomoniasis.  A bad vaginal smell. The smell is fishy with bacterial vaginosis.  Vaginal itching, pain, or swelling.  Pain with sex.  Pain or burning when urinating. Sometimes there are no symptoms. How is this diagnosed? This condition is diagnosed based on your symptoms and medical history. A physical exam, including a pelvic exam, will also be done. You may also have other tests, including:  Tests to determine the pH level (acidity  or alkalinity) of your vagina.  A whiff test to assess the odor that results when a sample of your vaginal discharge is mixed with a potassium hydroxide solution.  Tests of vaginal fluid. A sample will be examined under a microscope. How is this treated? Treatment varies depending on the type of vaginitis you have. Your treatment may include:  Antibiotic creams or pills to treat bacterial vaginosis and trichomoniasis.  Antifungal medicines, such as vaginal creams or suppositories, to treat a yeast infection.  Medicine to ease discomfort if you have viral vaginitis. Your sexual partner should also be treated.  Estrogen delivered in a cream, pill, suppository, or vaginal ring to treat atrophic vaginitis. If vaginal dryness occurs, lubricants and moisturizing creams may help. You may need to avoid scented soaps, sprays, or douches.  Stopping use of a product that is causing allergic vaginitis and then using a vaginal cream to treat the symptoms. Follow these instructions at home: Lifestyle  Keep your genital area clean and dry. Avoid soap, and only rinse the area with water.  Do not douche or use tampons until your health care provider says it is okay. Use sanitary pads, if needed.  Do not have sex until your health care provider approves. When you can return to sex, practice safe sex and use condoms.  Wipe from front to back. This avoids the spread of bacteria from the rectum to the vagina. General instructions  Take over-the-counter and prescription medicines only as told by your health care provider.  If you were prescribed an antibiotic medicine, take or use it as told by your health care provider. Do not stop taking or using the antibiotic even if you start to feel better.  Keep all follow-up visits. This is important. How is this prevented?  Use mild, unscented products. Do not use things that can irritate the vagina, such as fabric softeners. Avoid the following products if  they are scented: ? Feminine sprays. ? Detergents. ? Tampons. ? Feminine hygiene products. ? Soaps or bubble baths.  Let air reach your genital area. To do this: ? Wear cotton underwear to reduce moisture buildup. ? Avoid wearing underwear while you sleep. ? Avoid wearing tight pants and underwear or nylons without a cotton panel. ? Avoid wearing thong underwear.  Take off any wet clothing, such as bathing suits, as soon as possible.  Practice safe sex and use condoms. Contact a health care provider if:  You have abdominal or pelvic pain.  You have a fever or chills.  You have symptoms that last for more than 2-3 days. Get help right away if:  You have a fever and your symptoms suddenly get worse. Summary  Vaginitis is a condition in which the vaginal tissue becomes inflamed.This condition is most often caused by a change in the normal balance of bacteria and yeast that live in the vagina.  Treatment varies depending on the type of vaginitis you have.  Do not douche, use tampons, or  have sex until your health care provider approves. When you can return to sex, practice safe sex and use condoms. This information is not intended to replace advice given to you by your health care provider. Make sure you discuss any questions you have with your health care provider. Document Revised: 06/21/2019 Document Reviewed: 06/21/2019 Elsevier Patient Education  2021 [employer].

## 2020-03-21 ENCOUNTER — Ambulatory Visit: Payer: Medicaid Other | Admitting: *Deleted

## 2020-03-21 ENCOUNTER — Encounter: Payer: Self-pay | Admitting: *Deleted

## 2020-03-21 ENCOUNTER — Ambulatory Visit: Payer: Medicaid Other | Attending: Obstetrics and Gynecology

## 2020-03-21 VITALS — BP 112/50 | HR 58

## 2020-03-21 DIAGNOSIS — O365921 Maternal care for other known or suspected poor fetal growth, second trimester, fetus 1: Secondary | ICD-10-CM | POA: Insufficient documentation

## 2020-03-21 DIAGNOSIS — O365920 Maternal care for other known or suspected poor fetal growth, second trimester, not applicable or unspecified: Secondary | ICD-10-CM | POA: Diagnosis present

## 2020-03-21 DIAGNOSIS — O99332 Smoking (tobacco) complicating pregnancy, second trimester: Secondary | ICD-10-CM | POA: Insufficient documentation

## 2020-03-21 LAB — CERVICOVAGINAL ANCILLARY ONLY
Bacterial Vaginitis (gardnerella): NEGATIVE
Candida Glabrata: NEGATIVE
Candida Vaginitis: POSITIVE — AB
Chlamydia: NEGATIVE
Comment: NEGATIVE
Comment: NEGATIVE
Comment: NEGATIVE
Comment: NEGATIVE
Comment: NEGATIVE
Comment: NORMAL
Neisseria Gonorrhea: NEGATIVE
Trichomonas: NEGATIVE

## 2020-03-22 MED ORDER — TERCONAZOLE 0.4 % VA CREA: 1.0000 | TOPICAL_CREAM | Freq: Every day | VAGINAL | 0 refills | Status: DC

## 2020-03-22 NOTE — Addendum Note (Signed)
Addended by: Gerrit Heck L on: 03/22/2020 02:51 AM   Modules accepted: Orders

## 2020-03-28 ENCOUNTER — Encounter: Payer: Self-pay | Admitting: *Deleted

## 2020-03-28 ENCOUNTER — Other Ambulatory Visit: Payer: Self-pay

## 2020-03-28 ENCOUNTER — Ambulatory Visit: Payer: Medicaid Other | Attending: Obstetrics and Gynecology

## 2020-03-28 ENCOUNTER — Ambulatory Visit: Payer: Medicaid Other | Admitting: *Deleted

## 2020-03-28 VITALS — BP 113/50 | HR 64

## 2020-03-28 DIAGNOSIS — O99332 Smoking (tobacco) complicating pregnancy, second trimester: Secondary | ICD-10-CM | POA: Diagnosis present

## 2020-03-28 DIAGNOSIS — O365920 Maternal care for other known or suspected poor fetal growth, second trimester, not applicable or unspecified: Secondary | ICD-10-CM | POA: Diagnosis present

## 2020-03-28 DIAGNOSIS — Z3A27 27 weeks gestation of pregnancy: Secondary | ICD-10-CM | POA: Diagnosis not present

## 2020-03-28 DIAGNOSIS — F17210 Nicotine dependence, cigarettes, uncomplicated: Secondary | ICD-10-CM | POA: Diagnosis not present

## 2020-03-28 DIAGNOSIS — O365921 Maternal care for other known or suspected poor fetal growth, second trimester, fetus 1: Secondary | ICD-10-CM | POA: Diagnosis present

## 2020-03-31 ENCOUNTER — Other Ambulatory Visit: Payer: Self-pay | Admitting: *Deleted

## 2020-03-31 DIAGNOSIS — O365920 Maternal care for other known or suspected poor fetal growth, second trimester, not applicable or unspecified: Secondary | ICD-10-CM

## 2020-04-11 ENCOUNTER — Other Ambulatory Visit: Payer: Self-pay

## 2020-04-11 ENCOUNTER — Other Ambulatory Visit: Payer: Medicaid Other

## 2020-04-11 ENCOUNTER — Ambulatory Visit (INDEPENDENT_AMBULATORY_CARE_PROVIDER_SITE_OTHER): Payer: Medicaid Other | Admitting: Nurse Practitioner

## 2020-04-11 VITALS — BP 111/63 | HR 82 | Wt 147.0 lb

## 2020-04-11 DIAGNOSIS — Z3A29 29 weeks gestation of pregnancy: Secondary | ICD-10-CM

## 2020-04-11 DIAGNOSIS — Z3490 Encounter for supervision of normal pregnancy, unspecified, unspecified trimester: Secondary | ICD-10-CM

## 2020-04-11 DIAGNOSIS — Z23 Encounter for immunization: Secondary | ICD-10-CM | POA: Diagnosis not present

## 2020-04-11 DIAGNOSIS — F17200 Nicotine dependence, unspecified, uncomplicated: Secondary | ICD-10-CM

## 2020-04-11 NOTE — Progress Notes (Signed)
+   Fetal movement. No complaints.  

## 2020-04-11 NOTE — Progress Notes (Signed)
    Subjective:  Catherine Luna is a 25 y.o. G2P0010 at [redacted]w[redacted]d being seen today for ongoing prenatal care.  She is currently monitored for the following issues for this low-risk pregnancy and has Supervision of normal pregnancy, antepartum; ASCUS with positive high risk HPV cervical; and Current smoker on their problem list.  Patient reports no complaints.  Contractions: Not present. Vag. Bleeding: None.  Movement: Present. Denies leaking of fluid.   The following portions of the patient's history were reviewed and updated as appropriate: allergies, current medications, past family history, past medical history, past social history, past surgical history and problem list. Problem list updated.  Objective:   Vitals:   04/11/20 0915  BP: 111/63  Pulse: 82  Weight: 147 lb (66.7 kg)    Fetal Status: Fetal Heart Rate (bpm): 147   Movement: Present     General:  Alert, oriented and cooperative. Patient is in no acute distress.  Skin: Skin is warm and dry. No rash noted.   Cardiovascular: Normal heart rate noted  Respiratory: Normal respiratory effort, no problems with respiration noted  Abdomen: Soft, gravid, appropriate for gestational age. Pain/Pressure: Absent     Pelvic:  Cervical exam deferred        Extremities: Normal range of motion.  Edema: None  Mental Status: Normal mood and affect. Normal behavior. Normal judgment and thought content.   Urinalysis:      Assessment and Plan:  Pregnancy: G2P0010 at [redacted]w[redacted]d  1. Encounter for supervision of normal pregnancy, antepartum, unspecified gravidity Is taking classes and is asked lots of good questions about her birth plan Treated for yeast in March and is doing well - healed Reviewed taking BP weekly and entering it into [facility] [place] on 04-25-20  - Glucose Tolerance, 2 Hours w/1 Hour - CBC - RPR - HIV Antibody (routine testing w rflx)  2. Current smoker Has reduced her smoking but still has 1-2 per day and is interested in  stopping smoking. - Ambulatory referral to Integrated Behavior[facility]   Preterm labor symptoms and general obstetric precautions including but not limited to vaginal bleeding, contractions, leaking of fluid and fetal movement were reviewed in detail with the patient. Please refer to After Visit Summary for other counseling recommendations.  Return in about 2 weeks (around 04/25/2020) for in person ROB.  Nolene Bernheim, RN, MSN, NP-BC Nurse Practitioner, [facility] for [employer], [facility] 04/11/2020 10:14 AM

## 2020-04-11 NOTE — Patient Instructions (Signed)
Pregnancy and Smoking  Smoking during pregnancy is unhealthy for you and your baby. Smoke from cigarettes, e-cigarettes, pipes, and cigars contains many chemicals that can cause cancer (carcinogens). These products also contain a stimulant drug (nicotine). When you smoke, harmful substances that you breathe in enter your bloodstream and can be passed on to your baby. This can affect your baby's development. If you are planning to become pregnant or have recently become pregnant, talk with your health care provider about quitting smoking. You have a much better chance of having a healthy pregnancy and a healthy baby if you do not smoke while you are pregnant. How does smoking affect me? Smoking increases your risk for many long-term (chronic) diseases. These diseases include cancer, lung diseases, and heart disease. Smoking during pregnancy increases your risk of:  Losing the pregnancy (miscarriage or stillbirth).  Giving birth too early (premature birth).  Pregnancy outside of the uterus (tubal pregnancy).  Having your water break before labor begins.  Problems with the placenta, which is the organ that provides the baby nourishment and oxygen. These problems may include: ? Attachment of the placenta over the opening of the uterus (placenta previa). ? Detachment of the placenta before the baby's birth (placental abruption). How does smoking affect my baby? Before birth Smoking during pregnancy:  Decreases blood flow and oxygen to your baby.  Affects your baby's brain and lungs.  Slows your baby's growth in the uterus (intrauterine growth retardation). After birth Babies born to women who smoked during pregnancy:  May have symptoms of nicotine withdrawal.  May be born with a cleft lip, cleft palate, or other facial deformities.  May be too small at birth.  May have a high risk of: ? Serious health problems or lifelong disabilities. This may require long-term need for certain  medicines, therapies, or other treatments. ? Sudden infant death syndrome (SIDS). What can I do to lower my risk of problems from smoking?  Do not use any products that contain nicotine or tobacco. These products include cigarettes, chewing tobacco, and vaping devices, such as e-cigarettes. If you need help quitting, ask your health care provider.  Do not take nicotine supplements or medicine to help you quit smoking unless your health care provider tells you to do so.  Avoid secondhand smoke. Ask people who smoke to avoid smoking around you.  Identify people, places, things, and activities that make you want to smoke (triggers). Avoid them.  Talk with your health care provider about support strategies to quit smoking. Some methods to consider include: ? Counseling (smoking cessation counseling). ? Psychotherapy. ? Acupuncture. ? Hypnosis. ? Telephone hotlines for people trying to quit.   Where to find more information Learn more about smoking during pregnancy and quitting smoking from:  March of Dimes: www.marchofdimes.org  U.S. Department of Health and Human Services: women.smokefree.gov  American Cancer Society: www.cancer.org  American Heart Association: www.heart.[place]: www.cancer.gov For help to quit smoking:  National smoking cessation telephone hotline: 1-800-QUIT-NOW 828-208-5440) Contact a health care provider if:  You are struggling to quit smoking.  You are a smoker and you become pregnant or plan to become pregnant.  You start smoking again after giving birth. Summary  Smoking during pregnancy is unhealthy for you and your baby.  Tobacco smoke contains harmful substances that can affect a baby's health and development in the uterus.  Smoking increases the risk for serious problems, such as miscarriage, birth defects, or premature birth.  If you need help to  quit smoking, talk to your health care provider and ask about support  strategies such as counseling, hypnosis, acupuncture, or psychotherapy. This information is not intended to replace advice given to you by your health care provider. Make sure you discuss any questions you have with your health care provider. Document Revised: 09/14/2019 Document Reviewed: 09/14/2019 Elsevier Patient Education  2021 Elsevier Inc.  Steps to Quit Smoking Smoking tobacco is the leading cause of preventable death. It can affect almost every organ in the body. Smoking puts you and people around you at risk for many serious, long-lasting (chronic) diseases. Quitting smoking can be hard, but it is one of the best things that you can do for your health. It is never too late to quit. How do I get ready to quit? When you decide to quit smoking, make a plan to help you succeed. Before you quit:  Pick a date to quit. Set a date within the next 2 weeks to give you time to prepare.  Write down the reasons why you are quitting. Keep this list in places where you will see it often.  Tell your family, friends, and co-workers that you are quitting. Their support is important.  Talk with your doctor about the choices that may help you quit.  Find out if your health insurance will pay for these treatments.  Know the people, places, things, and activities that make you want to smoke (triggers). Avoid them. What first steps can I take to quit smoking?  Throw away all cigarettes at home, at work, and in your car.  Throw away the things that you use when you smoke, such as ashtrays and lighters.  Clean your car. Make sure to empty the ashtray.  Clean your home, including curtains and carpets. What can I do to help me quit smoking? Talk with your doctor about taking medicines and seeing a counselor at the same time. You are more likely to succeed when you do both.  If you are pregnant or breastfeeding, talk with your doctor about counseling or other ways to quit smoking. Do not take  medicine to help you quit smoking unless your doctor tells you to do so. To quit smoking: Quit right away  Quit smoking totally, instead of slowly cutting back on how much you smoke over a period of time.  Go to counseling. You are more likely to quit if you go to counseling sessions regularly. Take medicine You may take medicines to help you quit. Some medicines need a prescription, and some you can buy over-the-counter. Some medicines may contain a drug called nicotine to replace the nicotine in cigarettes. Medicines may:  Help you to stop having the desire to smoke (cravings).  Help to stop the problems that come when you stop smoking (withdrawal symptoms). Your doctor may ask you to use:  Nicotine patches, gum, or lozenges.  Nicotine inhalers or sprays.  Non-nicotine medicine that is taken by mouth. Find resources Find resources and other ways to help you quit smoking and remain smoke-free after you quit. These resources are most helpful when you use them often. They include:  Online chats with a Veterinary surgeon.  Phone quitlines.  Printed Materials engineer.  Support groups or group counseling.  Text messaging programs.  Mobile phone apps. Use apps on your mobile phone or tablet that can help you stick to your quit plan. There are many free apps for mobile phones and tablets as well as websites. Examples include Quit Guide from the [employer]  and smokefree.gov   What things can I do to make it easier to quit?  Talk to your family and friends. Ask them to support and encourage you.  Call a phone quitline (1-800-QUIT-NOW), reach out to support groups, or work with a Veterinary surgeon.  Ask people who smoke to not smoke around you.  Avoid places that make you want to smoke, such as: ? Bars. ? Parties. ? Smoke-break areas at work.  Spend time with people who do not smoke.  Lower the stress in your life. Stress can make you want to smoke. Try these things to help your stress: ? Getting  regular exercise. ? Doing deep-breathing exercises. ? Doing yoga. ? Meditating. ? Doing a body scan. To do this, close your eyes, focus on one area of your body at a time from head to toe. Notice which parts of your body are tense. Try to relax the muscles in those areas.   How will I feel when I quit smoking? Day 1 to 3 weeks Within the first 24 hours, you may start to have some problems that come from quitting tobacco. These problems are very bad 2-3 days after you quit, but they do not often last for more than 2-3 weeks. You may get these symptoms:  Mood swings.  Feeling restless, nervous, angry, or annoyed.  Trouble concentrating.  Dizziness.  Strong desire for high-sugar foods and nicotine.  Weight gain.  Trouble pooping (constipation).  Feeling like you may vomit (nausea).  Coughing or a sore throat.  Changes in how the medicines that you take for other issues work in your body.  Depression.  Trouble sleeping (insomnia). Week 3 and afterward After the first 2-3 weeks of quitting, you may start to notice more positive results, such as:  Better sense of smell and taste.  Less coughing and sore throat.  Slower heart rate.  Lower blood pressure.  Clearer skin.  Better breathing.  Fewer sick days. Quitting smoking can be hard. Do not give up if you fail the first time. Some people need to try a few times before they succeed. Do your best to stick to your quit plan, and talk with your doctor if you have any questions or concerns. Summary  Smoking tobacco is the leading cause of preventable death. Quitting smoking can be hard, but it is one of the best things that you can do for your health.  When you decide to quit smoking, make a plan to help you succeed.  Quit smoking right away, not slowly over a period of time.  When you start quitting, seek help from your doctor, family, or friends. This information is not intended to replace advice given to you by your  health care provider. Make sure you discuss any questions you have with your health care provider. Document Revised: 09/15/2018 Document Reviewed: 03/11/2018 Elsevier Patient Education  2021 [employer].

## 2020-04-12 LAB — CBC
Hematocrit: 36.2 % (ref 34.0–46.6)
Hemoglobin: 12 g/dL (ref 11.1–15.9)
MCH: 30.8 pg (ref 26.6–33.0)
MCHC: 33.1 g/dL (ref 31.5–35.7)
MCV: 93 fL (ref 79–97)
Platelets: 224 10*3/uL (ref 150–450)
RBC: 3.89 x10E6/uL (ref 3.77–5.28)
RDW: 12.6 % (ref 11.7–15.4)
WBC: 10.3 10*3/uL (ref 3.4–10.8)

## 2020-04-12 LAB — HIV ANTIBODY (ROUTINE TESTING W REFLEX): HIV Screen 4th Generation wRfx: NONREACTIVE

## 2020-04-12 LAB — GLUCOSE TOLERANCE, 2 HOURS W/ 1HR
Glucose, 1 hour: 136 mg/dL (ref 65–179)
Glucose, 2 hour: 67 mg/dL (ref 65–152)
Glucose, Fasting: 80 mg/dL (ref 65–91)

## 2020-04-12 LAB — RPR: RPR Ser Ql: NONREACTIVE

## 2020-04-25 ENCOUNTER — Other Ambulatory Visit: Payer: Self-pay | Admitting: Obstetrics and Gynecology

## 2020-04-25 ENCOUNTER — Ambulatory Visit: Payer: Medicaid Other | Admitting: *Deleted

## 2020-04-25 ENCOUNTER — Encounter: Payer: Self-pay | Admitting: *Deleted

## 2020-04-25 ENCOUNTER — Ambulatory Visit: Payer: Medicaid Other | Attending: Obstetrics and Gynecology

## 2020-04-25 ENCOUNTER — Other Ambulatory Visit: Payer: Self-pay | Admitting: *Deleted

## 2020-04-25 ENCOUNTER — Other Ambulatory Visit: Payer: Self-pay

## 2020-04-25 VITALS — BP 118/57 | HR 66

## 2020-04-25 DIAGNOSIS — F17210 Nicotine dependence, cigarettes, uncomplicated: Secondary | ICD-10-CM

## 2020-04-25 DIAGNOSIS — O365930 Maternal care for other known or suspected poor fetal growth, third trimester, not applicable or unspecified: Secondary | ICD-10-CM

## 2020-04-25 DIAGNOSIS — O365920 Maternal care for other known or suspected poor fetal growth, second trimester, not applicable or unspecified: Secondary | ICD-10-CM

## 2020-04-25 DIAGNOSIS — Z3A31 31 weeks gestation of pregnancy: Secondary | ICD-10-CM

## 2020-04-25 DIAGNOSIS — O99333 Smoking (tobacco) complicating pregnancy, third trimester: Secondary | ICD-10-CM

## 2020-04-25 NOTE — Progress Notes (Signed)
C/o "occasional lower abd pain"

## 2020-04-28 ENCOUNTER — Other Ambulatory Visit: Payer: Self-pay

## 2020-04-28 ENCOUNTER — Ambulatory Visit (INDEPENDENT_AMBULATORY_CARE_PROVIDER_SITE_OTHER): Payer: Medicaid Other | Admitting: Advanced Practice Midwife

## 2020-04-28 ENCOUNTER — Ambulatory Visit (INDEPENDENT_AMBULATORY_CARE_PROVIDER_SITE_OTHER): Payer: Medicaid Other | Admitting: Licensed Clinical Social Worker

## 2020-04-28 VITALS — BP 117/61 | HR 66 | Wt 146.0 lb

## 2020-04-28 DIAGNOSIS — F17290 Nicotine dependence, other tobacco product, uncomplicated: Secondary | ICD-10-CM | POA: Diagnosis not present

## 2020-04-28 DIAGNOSIS — F411 Generalized anxiety disorder: Secondary | ICD-10-CM | POA: Diagnosis not present

## 2020-04-28 DIAGNOSIS — F17210 Nicotine dependence, cigarettes, uncomplicated: Secondary | ICD-10-CM | POA: Diagnosis not present

## 2020-04-28 DIAGNOSIS — O0990 Supervision of high risk pregnancy, unspecified, unspecified trimester: Secondary | ICD-10-CM

## 2020-04-28 DIAGNOSIS — O99333 Smoking (tobacco) complicating pregnancy, third trimester: Secondary | ICD-10-CM | POA: Insufficient documentation

## 2020-04-28 DIAGNOSIS — O365990 Maternal care for other known or suspected poor fetal growth, unspecified trimester, not applicable or unspecified: Secondary | ICD-10-CM

## 2020-04-28 DIAGNOSIS — Z3A31 31 weeks gestation of pregnancy: Secondary | ICD-10-CM

## 2020-04-28 DIAGNOSIS — O26899 Other specified pregnancy related conditions, unspecified trimester: Secondary | ICD-10-CM

## 2020-04-28 DIAGNOSIS — R102 Pelvic and perineal pain: Secondary | ICD-10-CM

## 2020-04-28 NOTE — Patient Instructions (Signed)

## 2020-04-28 NOTE — Progress Notes (Signed)
   PRENATAL VISIT NOTE  Subjective:  Catherine Luna is a 25 y.o. G2P0010 at [redacted]w[redacted]d being seen today for ongoing prenatal care.  She is currently monitored for the following issues for this high-risk pregnancy and has Supervision of normal pregnancy, antepartum; ASCUS with positive high risk HPV cervical; Current smoker; and Pregnancy affected by fetal growth restriction on their problem list.  Patient reports pain RLQ sudden onset, sharp last week, improved now.  Contractions: Not present. Vag. Bleeding: None.  Movement: Present. Denies leaking of fluid.   The following portions of the patient's history were reviewed and updated as appropriate: allergies, current medications, past family history, past medical history, past social history, past surgical history and problem list.   Objective:   Vitals:   04/28/20 1056  BP: 117/61  Pulse: 66  Weight: 146 lb (66.2 kg)    Fetal Status: Fetal Heart Rate (bpm): 148   Movement: Present     General:  Alert, oriented and cooperative. Patient is in no acute distress.  Skin: Skin is warm and dry. No rash noted.   Cardiovascular: Normal heart rate noted  Respiratory: Normal respiratory effort, no problems with respiration noted  Abdomen: Soft, gravid, appropriate for gestational age.  Pain/Pressure: Absent     Pelvic: Cervical exam deferred        Extremities: Normal range of motion.  Edema: None  Mental Status: Normal mood and affect. Normal behavior. Normal judgment and thought content.   Assessment and Plan:  Pregnancy: G2P0010 at [redacted]w[redacted]d 1. Pregnancy affected by fetal growth restriction --EFW 10%tile on 4/22, weekly dopplers with MFM --Pt is small constitutionally but also vapes and is THC smoker --Discusses smoking as risk factor, pt interested in quitting, discussed options.  --Pt speaking with Sue Lush, which is helping  2. Supervision of high risk pregnancy, antepartum --Anticipatory guidance about next visits/weeks of pregnancy  given. --Visit with Sue Lush for integrated [facility] --Next visit in 2 weeks  3. [redacted] weeks gestation of pregnancy   4. Tobacco smoking affecting pregnancy, antepartum, third trimester --See above under FGR  5. Pain of round ligament affecting pregnancy, antepartum --Pt reports she was arguing with her s/o last week and had sharp pain in her RLQ. It remained sore x 2-3 days but is improved by today. No associated symptoms, no n/v, no vaginal bleeding or LOF.  --Pt reports no more arguing with her s/o this week.  She had visit with Sue Lush with integrated BH today and plans to follow up with her. --Rest/ice/heat/warm bath/Tylenol/pregnancy support belt   Preterm labor symptoms and general obstetric precautions including but not limited to vaginal bleeding, contractions, leaking of fluid and fetal movement were reviewed in detail with the patient. Please refer to After Visit Summary for other counseling recommendations.   Return in about 2 weeks (around 05/12/2020).  Future Appointments  Date Time Provider Department Center  05/02/2020  7:30 AM [facility] NURSE [facility]  05/02/2020  7:45 AM [facility] US6 [facility]  05/09/2020 11:30 AM [facility] NURSE [facility]  05/09/2020 11:45 AM [facility] US4 [facility]  05/12/2020  4:00 PM Constant, Peggy, MD [facility] None  05/16/2020  9:30 AM [facility] NURSE [facility]  05/16/2020  9:45 AM [facility] US4 [facility]  05/23/2020 10:00 AM [facility] NURSE [facility]  05/23/2020 10:15 AM [facility] US2 [facility]    Sharen Counter, CNM

## 2020-04-30 NOTE — BH Specialist Note (Signed)
Integrated Behavioral Health Initial In-Person Visit  MRN: [id number] Name: Catherine Luna  Number of Integrated Behavioral Health Clinician visits:: 1/6 Session Start time: 10:07am Session End time: 10:49am Total time:  42 minutes in person at [facility]   Types of Service: Individual psychotherapy  Interpretor:no  Interpretor Name and Language: none   Warm Hand Off Completed.       Subjective: Catherine Luna is a 25 y.o. female accompanied by n/a Patient was referred by Lilyan Punt NP for a. Patient reports the following symptoms/concerns: Anxiety, stress, limited support  Duration of problem: over one year  ; Severity of problem: mild  Objective: Mood: tearful  and Affect: sad  Risk of harm to self or others: No risk of harm to self or others.   Life Context: Family and Social: Lives with FOB in [place]  School/Work: [employer]  Self-Care: None  Life Changes: New pregnancy  Patient and/or Family's Strengths/Protective Factors: Limited family and social support   Goals Addressed: Patient will: 1. Reduce symptoms of: Nicotine dependence  2. Increase knowledge and/or ability of:  Identify triggers and implement coping skills to alleviate stress and regulate emotional and stressful triggers.  3. Demonstrate ability to: self manage symptoms   Progress towards Goals: Ongoing   Interventions: Interventions utilized: supportive counseling   Standardized Assessments completed: n/a   Assessment: Patient currently experiencing nicotine dependence    Patient may benefit from integrated behavioral health   Plan: 1. Follow up with behavioral health clinician on : 05/12/2020 2. Behavioral recommendations: Engage in support group focusing on smoking cessation, create smart goals, begin journal writing to identify triggers, and engage in light physical activity 3. Referral(s): n/a 4. "From scale of 1-10, how likely are you to follow plan?":   Gwyndolyn Saxon, LCSW

## 2020-05-02 ENCOUNTER — Other Ambulatory Visit: Payer: Self-pay

## 2020-05-02 ENCOUNTER — Encounter: Payer: Self-pay | Admitting: *Deleted

## 2020-05-02 ENCOUNTER — Other Ambulatory Visit: Payer: Self-pay | Admitting: Obstetrics

## 2020-05-02 ENCOUNTER — Ambulatory Visit: Payer: Medicaid Other | Admitting: *Deleted

## 2020-05-02 ENCOUNTER — Ambulatory Visit (HOSPITAL_BASED_OUTPATIENT_CLINIC_OR_DEPARTMENT_OTHER): Payer: Medicaid Other | Admitting: *Deleted

## 2020-05-02 ENCOUNTER — Ambulatory Visit: Payer: Medicaid Other | Attending: Obstetrics

## 2020-05-02 DIAGNOSIS — O365930 Maternal care for other known or suspected poor fetal growth, third trimester, not applicable or unspecified: Secondary | ICD-10-CM

## 2020-05-02 DIAGNOSIS — Z3A32 32 weeks gestation of pregnancy: Secondary | ICD-10-CM

## 2020-05-02 DIAGNOSIS — O0990 Supervision of high risk pregnancy, unspecified, unspecified trimester: Secondary | ICD-10-CM | POA: Diagnosis present

## 2020-05-02 DIAGNOSIS — O365990 Maternal care for other known or suspected poor fetal growth, unspecified trimester, not applicable or unspecified: Secondary | ICD-10-CM | POA: Diagnosis present

## 2020-05-02 NOTE — Procedures (Signed)
Catherine Luna 1995/08/18 [redacted]w[redacted]d  Fetus A Non-Stress Test Interpretation for 05/02/20  Indication: IUGR  Fetal Heart Rate A Mode: External Baseline Rate (A): 135 bpm Variability: Moderate Accelerations: 15 x 15 Decelerations: None Multiple birth?: No  Uterine Activity Mode: Palpation,Toco Contraction Frequency (min): none Resting Tone Palpated: Relaxed Resting Time: Adequate  Interpretation (Fetal Testing) Nonstress Test Interpretation: Reactive Overall Impression: Reassuring for gestational age Comments: Dr. Parke Poisson reviewed tracing.

## 2020-05-04 DIAGNOSIS — Z3A32 32 weeks gestation of pregnancy: Secondary | ICD-10-CM

## 2020-05-04 DIAGNOSIS — O365930 Maternal care for other known or suspected poor fetal growth, third trimester, not applicable or unspecified: Secondary | ICD-10-CM

## 2020-05-09 ENCOUNTER — Ambulatory Visit: Payer: Medicaid Other | Attending: Obstetrics and Gynecology

## 2020-05-09 ENCOUNTER — Other Ambulatory Visit: Payer: Self-pay

## 2020-05-09 ENCOUNTER — Encounter: Payer: Self-pay | Admitting: *Deleted

## 2020-05-09 ENCOUNTER — Ambulatory Visit: Payer: Medicaid Other | Admitting: *Deleted

## 2020-05-09 DIAGNOSIS — O365930 Maternal care for other known or suspected poor fetal growth, third trimester, not applicable or unspecified: Secondary | ICD-10-CM | POA: Diagnosis present

## 2020-05-09 DIAGNOSIS — O0990 Supervision of high risk pregnancy, unspecified, unspecified trimester: Secondary | ICD-10-CM | POA: Insufficient documentation

## 2020-05-12 ENCOUNTER — Ambulatory Visit: Payer: Medicaid Other | Admitting: Licensed Clinical Social Worker

## 2020-05-12 ENCOUNTER — Encounter: Payer: Medicaid Other | Admitting: Obstetrics and Gynecology

## 2020-05-12 ENCOUNTER — Other Ambulatory Visit: Payer: Self-pay | Admitting: *Deleted

## 2020-05-13 ENCOUNTER — Encounter: Payer: Medicaid Other | Admitting: Obstetrics & Gynecology

## 2020-05-15 ENCOUNTER — Ambulatory Visit (INDEPENDENT_AMBULATORY_CARE_PROVIDER_SITE_OTHER): Payer: Medicaid Other | Admitting: Student

## 2020-05-15 ENCOUNTER — Encounter: Payer: Self-pay | Admitting: Student

## 2020-05-15 ENCOUNTER — Other Ambulatory Visit: Payer: Self-pay

## 2020-05-15 VITALS — BP 126/77 | HR 64 | Wt 150.0 lb

## 2020-05-15 DIAGNOSIS — O0990 Supervision of high risk pregnancy, unspecified, unspecified trimester: Secondary | ICD-10-CM

## 2020-05-15 MED ORDER — HYDROCORTISONE (PERIANAL) 2.5 % EX CREA: 1.0000 "application " | TOPICAL_CREAM | Freq: Two times a day (BID) | CUTANEOUS | 0 refills | Status: AC

## 2020-05-15 MED ORDER — HYDROCORTISONE ACETATE 25 MG RE SUPP: 25.0000 mg | Freq: Two times a day (BID) | RECTAL | 1 refills | Status: DC

## 2020-05-15 NOTE — Patient Instructions (Addendum)
-  referral to general surgery - go to [facility] and get a sitz bath basin.  -take anusol suppositories  and apply cream   [facility] call them directly [phone]  How do you do a sitz bath? Add  to 1 tablespoon (5 mL to 15 mL) of baking soda or 1 to 2 teaspoons (5 mL to 10 mL) of salt to the water in the plastic sitz bath. Swirl the water until the baking soda or salt dissolves. 5. Carefully sit down in the plastic sitz bath and soak your bottom area for 10 to 15 minutes.

## 2020-05-15 NOTE — Progress Notes (Signed)
ROB [redacted]w[redacted]d  CC: hemorrhoids causing pain wants evaluation.

## 2020-05-15 NOTE — Progress Notes (Addendum)
   PRENATAL VISIT NOTE  Subjective:  Catherine Luna is a 25 y.o. G2P0010 at [redacted]w[redacted]d being seen today for ongoing prenatal care.  She is currently monitored for the following issues for this high-risk pregnancy and has Supervision of high risk pregnancy, antepartum; ASCUS with positive high risk HPV cervical; Current smoker; Pregnancy affected by fetal growth restriction; and Tobacco smoking affecting pregnancy, antepartum, third trimester on their problem list.  Patient reports hemorroid. She noticed it two days ago. It was worse after work and driving home. Got a little small this morning. It is tender.  Contractions: Irritability. Vag. Bleeding: None.  Movement: Present. Denies leaking of fluid.   The following portions of the patient's history were reviewed and updated as appropriate: allergies, current medications, past family history, past medical history, past social history, past surgical history and problem list.   Objective:   Vitals:   05/15/20 1047  BP: 126/77  Pulse: 64  Weight: 150 lb (68 kg)    Fetal Status: Fetal Heart Rate (bpm): 150 Fundal Height: 31 cm Movement: Present     General:  Alert, oriented and cooperative. Patient is in no acute distress.  Skin: Skin is warm and dry. No rash noted.   Cardiovascular: Normal heart rate noted  Respiratory: Normal respiratory effort, no problems with respiration noted  Abdomen: Soft, gravid, appropriate for gestational age.  Pain/Pressure: Absent     Pelvic: Cervical exam deferred      . Patient has small hemorrhoid  That is tender to palpation, turning purple at the top but overall pink.   Extremities: Normal range of motion.  Edema: None  Mental Status: Normal mood and affect. Normal behavior. Normal judgment and thought content.   Assessment and Plan:  Pregnancy: G2P0010 at [redacted]w[redacted]d 1. Supervision of high risk pregnancy, antepartum -discussed hemorroid with Dr. Jolayne Panther, who recommends sitz bath, anusol, and referral to  general surgery in case it does not improve. Explained what thrombosed hemorrhoid is and treatment.  -instructions given on how to use medicine and do sitz bath -patient to keep Growth [place] tomorrow - Ambulatory referral to General Surgery  Preterm labor symptoms and general obstetric precautions including but not limited to vaginal bleeding, contractions, leaking of fluid and fetal movement were reviewed in detail with the patient. Please refer to After Visit Summary for other counseling recommendations.   Return in about 2 weeks (around 05/29/2020), or [facility] in 3 weeks.  Future Appointments  Date Time Provider Department Center  05/16/2020  9:30 AM [facility] NURSE [facility]  05/16/2020  9:45 AM [facility] US5 [facility]  05/23/2020 10:00 AM [facility] NURSE [facility]  05/23/2020 10:15 AM [facility] US2 [facility]  05/29/2020 10:15 AM Crisoforo Oxford, Charlesetta Garibaldi, CNM [facility] None  05/30/2020  8:00 AM [facility] NURSE [facility]  05/30/2020  8:15 AM [facility] US2 [facility]  06/06/2020 10:00 AM [facility] NURSE [facility]  06/06/2020 10:15 AM [facility] US2 [facility]    Samara Deist Gena Fray, CNM

## 2020-05-16 ENCOUNTER — Ambulatory Visit: Payer: Medicaid Other | Attending: Obstetrics

## 2020-05-16 ENCOUNTER — Ambulatory Visit: Payer: Medicaid Other | Admitting: *Deleted

## 2020-05-16 ENCOUNTER — Encounter: Payer: Self-pay | Admitting: *Deleted

## 2020-05-16 DIAGNOSIS — O365930 Maternal care for other known or suspected poor fetal growth, third trimester, not applicable or unspecified: Secondary | ICD-10-CM | POA: Diagnosis present

## 2020-05-16 DIAGNOSIS — O99333 Smoking (tobacco) complicating pregnancy, third trimester: Secondary | ICD-10-CM

## 2020-05-16 DIAGNOSIS — O0990 Supervision of high risk pregnancy, unspecified, unspecified trimester: Secondary | ICD-10-CM

## 2020-05-16 DIAGNOSIS — Z362 Encounter for other antenatal screening follow-up: Secondary | ICD-10-CM

## 2020-05-16 DIAGNOSIS — Z3A34 34 weeks gestation of pregnancy: Secondary | ICD-10-CM | POA: Diagnosis not present

## 2020-05-23 ENCOUNTER — Ambulatory Visit: Payer: Medicaid Other | Admitting: *Deleted

## 2020-05-23 ENCOUNTER — Encounter: Payer: Self-pay | Admitting: *Deleted

## 2020-05-23 ENCOUNTER — Ambulatory Visit: Payer: Medicaid Other | Attending: Obstetrics

## 2020-05-23 ENCOUNTER — Other Ambulatory Visit: Payer: Self-pay

## 2020-05-23 DIAGNOSIS — O0990 Supervision of high risk pregnancy, unspecified, unspecified trimester: Secondary | ICD-10-CM | POA: Insufficient documentation

## 2020-05-23 DIAGNOSIS — O365930 Maternal care for other known or suspected poor fetal growth, third trimester, not applicable or unspecified: Secondary | ICD-10-CM | POA: Insufficient documentation

## 2020-05-29 ENCOUNTER — Ambulatory Visit (INDEPENDENT_AMBULATORY_CARE_PROVIDER_SITE_OTHER): Payer: Medicaid Other

## 2020-05-29 ENCOUNTER — Other Ambulatory Visit: Payer: Self-pay

## 2020-05-29 VITALS — BP 130/84 | HR 64 | Wt 155.0 lb

## 2020-05-29 DIAGNOSIS — O365990 Maternal care for other known or suspected poor fetal growth, unspecified trimester, not applicable or unspecified: Secondary | ICD-10-CM

## 2020-05-29 DIAGNOSIS — Z3A35 35 weeks gestation of pregnancy: Secondary | ICD-10-CM

## 2020-05-29 DIAGNOSIS — O0990 Supervision of high risk pregnancy, unspecified, unspecified trimester: Secondary | ICD-10-CM

## 2020-05-29 DIAGNOSIS — O99333 Smoking (tobacco) complicating pregnancy, third trimester: Secondary | ICD-10-CM

## 2020-05-29 NOTE — Patient Instructions (Addendum)
Fetal Growth Restriction  Fetal growth restriction, also known as intrauterine growth restriction (IUGR), is when a baby is not growing normally during pregnancy. A baby with fetal growth restriction is smaller than he or she should be and may weigh less than normal at birth. Fetal growth restriction can result from a problem with the placenta, which is an organ that supplies the unborn baby (fetus) with oxygen and nutrition. Babies with fetal growth restriction are at higher risk for early delivery and may need more care than usual after birth. What are the causes? The most common cause of fetal growth restriction is a problem with the placenta or umbilical cord that causes the fetus to get less oxygen or nutrition than needed. Other causes include:  Poor maternal nutrition and not enough weight gain during pregnancy.  Exposure to chemicals found in substances such as cigarettes, alcohol, and some drugs.  Some prescription medicines.  Other problems that develop in the womb (congenital birth defects).  Genetic disorders.  Infection.  Carrying more than one baby. What increases the risk? This condition is more likely to affect your baby if you:  Are older than age 25 or younger than age 25.  Have medical conditions such as high blood pressure, preeclampsia, diabetes, heart or kidney disease, systemic lupus erythematosus, or anemia.  Live at a very high altitude during pregnancy.  Have a personal history or family history of: ? Fetal growth restriction. ? A genetic disorder.  Have had treatments to help have children (infertility treatments). What are the signs or symptoms? Fetal growth restriction does not cause many symptoms. Your health care provider may suspect this condition if your belly area (fundus) is not as big as expected for the stage of your pregnancy. How is this diagnosed? This condition is diagnosed with physical exams and prenatal exams. You may also  have:  Fundal height measurements to check the size of your uterus. The fundal height is the distance from the pubic bone to the top of the uterus.  An ultrasound done to measure your baby's size compared to the size of other babies at the same stage of development (gestational age). You may also have tests to find the cause of fetal growth restriction. These may include:  Amniocentesis. This is a procedure that involves passing a needle into the uterus to collect a sample of fluid that surrounds the fetus (amniotic fluid). This may be done to check for signs of infection or congenital defects.  Tests to evaluate blood flow to your baby and placenta. How is this treated? In most cases, the goal of treatment is to treat the cause of fetal growth restriction. Your health care providers will monitor your pregnancy closely and help you manage your pregnancy. If your condition is caused by a problem with the placenta and your baby is not getting enough blood, you may need:  Medicine to start labor and deliver your baby early (induction).  Cesarean delivery, also called a C-section. In this procedure, your baby is delivered through an incision in your abdomen and uterus. Follow these instructions at home: Medicines  Take over-the-counter and prescription medicines only as told by your health care provider. This includes vitamins and supplements.  Make sure that your health care provider knows about and approves of all medicines, supplements, vitamins, eye drops, and creams that you use. General instructions  Eat a healthy diet that includes fresh fruits and vegetables, lean proteins, whole grains, and calcium-rich foods such as milk, yogurt, and  dark, leafy greens. Work with your health care provider or a dietitian to make sure that: ? You are getting enough nutrients. ? You are gaining enough weight during your pregnancy.  Rest as needed. Try to get at least 8 hours of sleep every  night.  Do not drink alcohol or use drugs.  Do not use any products that contain nicotine or tobacco. These products include cigarettes, chewing tobacco, and vaping devices, such as e-cigarettes. If you need help quitting, ask your health care provider.  Keep all follow-up visits. This is important. Get help right away if:  You notice that your unborn baby is moving less than usual or is not moving.  You have contractions that are 5 minutes or less apart, or that increase in frequency, intensity, or length.  You have signs and symptoms of infection, including a fever.  You have vaginal bleeding.  You have increased swelling in your legs, hands, or face.  You have vision changes, including seeing spots or having blurry or double vision.  You have a severe headache that does not go away.  You have a sudden, sharp pain in the abdomen or low back pain.  You have an uncontrolled gush or trickle of fluid from your vagina. Summary  Fetal growth restriction is when a baby is not growing normally during pregnancy.  The most common cause of fetal growth restriction is a problem with the placenta or umbilical cord that causes the fetus to get less oxygen or nutrition than needed.  This condition is diagnosed with physical and prenatal exams.  Your health care provider will monitor your baby's growth with ultrasounds throughout pregnancy.  Make sure that your health care provider knows about and approves of all medicines, supplements, vitamins, eye drops, and creams that you use. This information is not intended to replace advice given to you by your health care provider. Make sure you discuss any questions you have with your health care provider. Document Revised: 08/13/2019 Document Reviewed: 08/13/2019 Elsevier Patient Education  2021 [employer].  Labor Induction Labor induction is when steps are taken to cause a pregnant woman to begin the labor process. Most women go into labor  on their own between 37 weeks and 42 weeks of pregnancy. When this does not happen, or when there is a medical need for labor to begin, steps may be taken to induce, or bring on, labor. Labor induction causes a pregnant woman's uterus to contract. It also causes the cervix to soften (ripen), open (dilate), and thin out. Usually, labor is not induced before 39 weeks of pregnancy unless there is a medical reason to do so. When is labor induction considered? Labor induction may be right for you if:  Your pregnancy lasts longer than 41 to 42 weeks.  Your placenta is separating from your uterus (placental abruption).  You have a rupture of membranes and your labor does not begin.  You have health problems, like diabetes or high blood pressure (preeclampsia) during your pregnancy.  Your baby has stopped growing or does not have enough amniotic fluid. Before labor induction begins, your health care provider will consider the following factors:  Your medical condition and the baby's condition.  How many weeks you have been pregnant.  How mature the baby's lungs are.  The condition of your cervix.  The position of the baby.  The size of your birth canal. Tell a health care provider about:  Any allergies you have.  All medicines you are taking,  including vitamins, herbs, eye drops, creams, and over-the-counter medicines.  Any problems you or your family members have had with anesthetic medicines.  Any surgeries you have had.  Any blood disorders you have.  Any medical conditions you have. What are the risks? Generally, this is a safe procedure. However, problems may occur, including:  Failed induction.  Changes in fetal heart rate, such as being too high, too low, or irregular (erratic).  Infection in the mother or the baby.  Increased risk of having a cesarean delivery.  Breaking off (abruption) of the placenta from the uterus. This is rare.  Rupture of the uterus. This is  very rare.  Your baby could fail to get enough blood flow or oxygen. This can be life-threatening. When induction is needed for medical reasons, the benefits generally outweigh the risks. What happens during the procedure? During the procedure, your health care provider will use one of these methods to induce labor:  Stripping the membranes. In this method, the amniotic sac tissue is gently separated from the cervix. This causes the following to happen: ? Your cervix stretches, which in turn causes the release of prostaglandins. ? Prostaglandins induce labor and cause the uterus to contract. ? This procedure is often done in an office visit. You will be sent home to wait for contractions to begin.  Prostaglandin medicine. This medicine starts contractions and causes the cervix to dilate and ripen. This can be taken by mouth (orally) or by being inserted into the vagina (suppository).  Inserting a small, thin tube (catheter) with a balloon into the vagina and then expanding the balloon with water to dilate the cervix.  Breaking the water. In this method, a small instrument is used to make a small hole in the amniotic sac. This eventually causes the amniotic sac to break. Contractions should begin within a few hours.  Medicine to trigger or strengthen contractions. This medicine is given through an IV that is inserted into a vein in your arm. This procedure may vary among health care providers and hospitals.   Where to find more information  [employer] of [employer]: www.marchofdimes.org  [employer] of [employer]ians and [employer]necologists: www.acog.org Summary  Labor induction causes a pregnant woman's uterus to contract. It also causes the cervix to soften (ripen), open (dilate), and thin out.  Labor is usually not induced before 39 weeks of pregnancy unless there is a medical reason to do so.  When induction is needed for medical reasons, the benefits generally outweigh the risks.  Talk  with your health care provider about which methods of labor induction are right for you. This information is not intended to replace advice given to you by your health care provider. Make sure you discuss any questions you have with your health care provider. Document Revised: 10/04/2019 Document Reviewed: 10/04/2019 Elsevier Patient Education  2021 [employer].

## 2020-05-29 NOTE — Progress Notes (Signed)
HIGH-RISK PREGNANCY OFFICE VISIT  Patient name: Catherine Luna MRN [id number]  Date of birth: Sep 13, 1995 Chief Complaint:   Routine Prenatal Visit  Subjective:   Catherine Luna is a 25 y.o. G27P0010 female at [redacted]w[redacted]d with an Estimated Date of Delivery: 06/27/20 being seen today for ongoing management of a high-risk pregnancy aeb has Supervision of high risk pregnancy, antepartum; ASCUS with positive high risk HPV cervical; Current smoker; Pregnancy affected by fetal growth restriction; and Tobacco smoking affecting pregnancy, antepartum, third trimester on their problem list.  Patient presents today with concerns for induction of labor.  She understands that recommendation is related to infant weight, but wants the process to occur naturally.  She also reports that her hemorrhoids have improved, but they still remain.  Patient questions if this is normal.  Patient would like to review her birth plan.   Patient endorses fetal movement. Patient denies abdominal cramping or contractions.  Patient denies vaginal concerns including abnormal discharge, leaking of fluid, and bleeding.  Contractions: Irritability. Vag. Bleeding: None.  Movement: Present.  Reviewed past medical,surgical, social, obstetrical and family history as well as problem list, medications and allergies.  Objective   Vitals:   05/29/20 1019  BP: 130/84  Pulse: 64  Weight: 155 lb (70.3 kg)  Body mass index is 25.02 kg/m.  Total Weight Gain:25 lb (11.3 kg)         Physical Examination:   General appearance: Well appearing, and in no distress  Mental status: Alert, oriented to person, place, and time  Skin: Warm & dry  Cardiovascular: Normal heart rate noted  Respiratory: Normal respiratory effort, no distress  Abdomen: Soft, gravid, nontender, AGA with Fundal Height: 34 cm  Pelvic: Cervical exam deferred           Extremities: Edema: None  Fetal Status: Fetal Heart Rate (bpm): 140  Movement: Present   No  results found for this or any previous visit (from the past 24 hour(s)).  Assessment & Plan:  High-risk pregnancy of a 25 y.o., G2P0010 at [redacted]w[redacted]d with an Estimated Date of Delivery: 06/27/20   1. Supervision of high risk pregnancy, antepartum Anticipatory guidance for upcoming appts. -Patient to next appt in 1 weeks for an in-person visit. -Educated on GBS bacteria including what it is, why we test, and how and when we treat if needed. -Scheduled for IOL on June 10th at [facility] -Admission orders placed.  -Reviewed hemorrhoid course and how resolution should occur after delivery. -Informed that okay to continue to use rectal cream as ordered.   2. [redacted] weeks gestation of pregnancy -Doing well overall. -Reviewed birth plan and desires for labor. -Reviewed hospital support person policy.  -Discussed desire for warm/cold therapy to help cope with labor.  Informed that support people will have to assist in managing as nurse may be caring for multiple patient's. -Informed that the need for internal monitoring will be evaluated as necessary, but provider can not guarantee that this will not be recommended. -Discussed breastfeeding in setting of growth restricted infant.  Cautioned that some difficulties with latch may be noted initially.  Also discussed how supplementation may be recommended by pediatrician. -Instructed to upload her birth plan to chart via mychart for all to access.  -Briefly reviewed induction procedures including foley bulb, cytotec, and pitocin usage.   3. Pregnancy affected by fetal growth restriction -5/13 Growth and 5/20 BPP [place] Reviewed. -Fetus in 7th%ile.  BPP 8/8 with normal dopplers. -Educated on fetal growth restriction and how likely caused  by placental insufficiency. -Discussed risks and benefits of delivery vs. continuation of pregnancy beyond MFM recommendation of 38 weeks. -Patient verbalizes understanding and has no questions or concerns.   4. Tobacco smoking affecting  pregnancy, antepartum, third trimester -Reviewed how smoking plays a role in FGR and placental insufficiency.    Meds: No orders of the defined types were placed in this encounter.  Labs/procedures today:  Lab Orders  No laboratory test(s) ordered today     Reviewed: Preterm labor symptoms and general obstetric precautions including but not limited to vaginal bleeding, contractions, leaking of fluid and fetal movement were reviewed in detail with the patient.  All questions were answered.  Follow-up: Return in about 1 week (around 06/05/2020) for HROB with GBS.  No orders of the defined types were placed in this encounter.  Cherre Robins MSN, CNM 05/29/2020

## 2020-05-29 NOTE — Progress Notes (Signed)
ROB wants to discuss birth plan, wants natural child birth.

## 2020-05-30 ENCOUNTER — Other Ambulatory Visit: Payer: Self-pay | Admitting: Obstetrics

## 2020-05-30 ENCOUNTER — Ambulatory Visit: Payer: Medicaid Other | Admitting: *Deleted

## 2020-05-30 ENCOUNTER — Encounter: Payer: Self-pay | Admitting: *Deleted

## 2020-05-30 ENCOUNTER — Ambulatory Visit: Payer: Medicaid Other | Attending: Obstetrics

## 2020-05-30 DIAGNOSIS — O0990 Supervision of high risk pregnancy, unspecified, unspecified trimester: Secondary | ICD-10-CM

## 2020-05-30 DIAGNOSIS — O365930 Maternal care for other known or suspected poor fetal growth, third trimester, not applicable or unspecified: Secondary | ICD-10-CM

## 2020-05-30 DIAGNOSIS — F17210 Nicotine dependence, cigarettes, uncomplicated: Secondary | ICD-10-CM | POA: Diagnosis not present

## 2020-05-30 DIAGNOSIS — Z3A36 36 weeks gestation of pregnancy: Secondary | ICD-10-CM | POA: Insufficient documentation

## 2020-05-30 DIAGNOSIS — O99333 Smoking (tobacco) complicating pregnancy, third trimester: Secondary | ICD-10-CM | POA: Diagnosis not present

## 2020-05-31 ENCOUNTER — Other Ambulatory Visit: Payer: Self-pay | Admitting: Advanced Practice Midwife

## 2020-06-03 ENCOUNTER — Other Ambulatory Visit: Payer: Self-pay

## 2020-06-03 ENCOUNTER — Encounter: Payer: Self-pay | Admitting: Obstetrics & Gynecology

## 2020-06-03 ENCOUNTER — Other Ambulatory Visit (HOSPITAL_COMMUNITY)
Admission: RE | Admit: 2020-06-03 | Discharge: 2020-06-03 | Disposition: A | Discharge status: Home / Self Care | Payer: Medicaid Other | Source: Ambulatory Visit | Attending: Obstetrics & Gynecology | Admitting: Obstetrics & Gynecology

## 2020-06-03 ENCOUNTER — Ambulatory Visit (INDEPENDENT_AMBULATORY_CARE_PROVIDER_SITE_OTHER): Payer: Medicaid Other | Admitting: Obstetrics & Gynecology

## 2020-06-03 VITALS — BP 127/76 | HR 53 | Wt 153.0 lb

## 2020-06-03 DIAGNOSIS — O0990 Supervision of high risk pregnancy, unspecified, unspecified trimester: Secondary | ICD-10-CM | POA: Diagnosis not present

## 2020-06-03 DIAGNOSIS — O365990 Maternal care for other known or suspected poor fetal growth, unspecified trimester, not applicable or unspecified: Secondary | ICD-10-CM

## 2020-06-03 DIAGNOSIS — O99333 Smoking (tobacco) complicating pregnancy, third trimester: Secondary | ICD-10-CM

## 2020-06-03 NOTE — Patient Instructions (Signed)
Labor Induction Labor induction is when steps are taken to cause a pregnant woman to begin the labor process. Most women go into labor on their own between 37 weeks and 42 weeks of pregnancy. When this does not happen, or when there is a medical need for labor to begin, steps may be taken to induce, or bring on, labor. Labor induction causes a pregnant woman's uterus to contract. It also causes the cervix to soften (ripen), open (dilate), and thin out. Usually, labor is not induced before 39 weeks of pregnancy unless there is a medical reason to do so. When is labor induction considered? Labor induction may be right for you if:  Your pregnancy lasts longer than 41 to 42 weeks.  Your placenta is separating from your uterus (placental abruption).  You have a rupture of membranes and your labor does not begin.  You have health problems, like diabetes or high blood pressure (preeclampsia) during your pregnancy.  Your baby has stopped growing or does not have enough amniotic fluid. Before labor induction begins, your health care provider will consider the following factors:  Your medical condition and the baby's condition.  How many weeks you have been pregnant.  How mature the baby's lungs are.  The condition of your cervix.  The position of the baby.  The size of your birth canal. Tell a health care provider about:  Any allergies you have.  All medicines you are taking, including vitamins, herbs, eye drops, creams, and over-the-counter medicines.  Any problems you or your family members have had with anesthetic medicines.  Any surgeries you have had.  Any blood disorders you have.  Any medical conditions you have. What are the risks? Generally, this is a safe procedure. However, problems may occur, including:  Failed induction.  Changes in fetal heart rate, such as being too high, too low, or irregular (erratic).  Infection in the mother or the baby.  Increased risk of  having a cesarean delivery.  Breaking off (abruption) of the placenta from the uterus. This is rare.  Rupture of the uterus. This is very rare.  Your baby could fail to get enough blood flow or oxygen. This can be life-threatening. When induction is needed for medical reasons, the benefits generally outweigh the risks. What happens during the procedure? During the procedure, your health care provider will use one of these methods to induce labor:  Stripping the membranes. In this method, the amniotic sac tissue is gently separated from the cervix. This causes the following to happen: ? Your cervix stretches, which in turn causes the release of prostaglandins. ? Prostaglandins induce labor and cause the uterus to contract. ? This procedure is often done in an office visit. You will be sent home to wait for contractions to begin.  Prostaglandin medicine. This medicine starts contractions and causes the cervix to dilate and ripen. This can be taken by mouth (orally) or by being inserted into the vagina (suppository).  Inserting a small, thin tube (catheter) with a balloon into the vagina and then expanding the balloon with water to dilate the cervix.  Breaking the water. In this method, a small instrument is used to make a small hole in the amniotic sac. This eventually causes the amniotic sac to break. Contractions should begin within a few hours.  Medicine to trigger or strengthen contractions. This medicine is given through an IV that is inserted into a vein in your arm. This procedure may vary among health care providers and hospitals.     Where to find more information  March of D[employer]: www.marchofdimes.org  The American College of Obstetricians and Gynecologists: www.acog.org Summary  Labor induction causes a pregnant woman's uterus to contract. It also causes the cervix to soften (ripen), open (dilate), and thin out.  Labor is usually not induced before 39 weeks of pregnancy unless  there is a medical reason to do so.  When induction is needed for medical reasons, the benefits generally outweigh the risks.  Talk with your health care provider about which methods of labor induction are right for you. This information is not intended to replace advice given to you by your health care provider. Make sure you discuss any questions you have with your health care provider. Document Revised: 10/04/2019 Document Reviewed: 10/04/2019 Elsevier Patient Education  2021 Elsevier Inc.  

## 2020-06-03 NOTE — Progress Notes (Signed)
   PRENATAL VISIT NOTE  Subjective:  Catherine Luna is a 25 y.o. G2P0010 at [redacted]w[redacted]d being seen today for ongoing prenatal care.  She is currently monitored for the following issues for this high-risk pregnancy and has Supervision of high risk pregnancy, antepartum; ASCUS with positive high risk HPV cervical; Current smoker; Pregnancy affected by fetal growth restriction; and Tobacco smoking affecting pregnancy, antepartum, third trimester on their problem list.  Patient reports occasional contractions.  Contractions: Irritability. Vag. Bleeding: None.  Movement: Present. Denies leaking of fluid.   The following portions of the patient's history were reviewed and updated as appropriate: allergies, current medications, past family history, past medical history, past social history, past surgical history and problem list.   Objective:   Vitals:   06/03/20 0841  BP: 127/76  Pulse: (!) 53  Weight: 153 lb (69.4 kg)    Fetal Status: Fetal Heart Rate (bpm): 142   Movement: Present     General:  Alert, oriented and cooperative. Patient is in no acute distress.  Skin: Skin is warm and dry. No rash noted.   Cardiovascular: Normal heart rate noted  Respiratory: Normal respiratory effort, no problems with respiration noted  Abdomen: Soft, gravid, appropriate for gestational age.  Pain/Pressure: Present     Pelvic: Cervical exam deferred        Extremities: Normal range of motion.  Edema: None  Mental Status: Normal mood and affect. Normal behavior. Normal judgment and thought content.   Assessment and Plan:  Pregnancy: G2P0010 at [redacted]w[redacted]d 1. Supervision of high risk pregnancy, antepartum F/u growth [place] and dopplers for IUGR. IOL is scheduled for 38 weeks but she will request going to 39 weeks if possible  Preterm labor symptoms and general obstetric precautions including but not limited to vaginal bleeding, contractions, leaking of fluid and fetal movement were reviewed in detail with the  patient. Please refer to After Visit Summary for other counseling recommendations.   Return in about 1 week (around 06/10/2020).  Future Appointments  Date Time Provider Department Center  06/06/2020 10:00 AM [facility] NURSE [facility]  06/06/2020 10:15 AM [facility] US2 [facility]  06/11/2020  8:55 AM Nugent, Odie Sera, NP [facility] None  06/13/2020 12:00 AM [facility] SCHED ROOM [facility] None    Scheryl Darter, MD

## 2020-06-03 NOTE — Progress Notes (Signed)
ROB [redacted]w[redacted]d Pt sent Birth Plan to office plan is scanned in "Media"  CC: None   Pt declines cervix check today.

## 2020-06-04 LAB — CERVICOVAGINAL ANCILLARY ONLY
Chlamydia: NEGATIVE
Comment: NEGATIVE
Comment: NORMAL
Neisseria Gonorrhea: NEGATIVE

## 2020-06-05 ENCOUNTER — Encounter: Payer: Medicaid Other | Admitting: Obstetrics & Gynecology

## 2020-06-06 ENCOUNTER — Ambulatory Visit: Payer: Medicaid Other | Admitting: *Deleted

## 2020-06-06 ENCOUNTER — Ambulatory Visit: Payer: Medicaid Other | Attending: Obstetrics

## 2020-06-06 ENCOUNTER — Encounter: Payer: Self-pay | Admitting: *Deleted

## 2020-06-06 ENCOUNTER — Other Ambulatory Visit: Payer: Self-pay

## 2020-06-06 ENCOUNTER — Other Ambulatory Visit: Payer: Self-pay | Admitting: Obstetrics

## 2020-06-06 DIAGNOSIS — O0993 Supervision of high risk pregnancy, unspecified, third trimester: Secondary | ICD-10-CM | POA: Diagnosis not present

## 2020-06-06 DIAGNOSIS — O365930 Maternal care for other known or suspected poor fetal growth, third trimester, not applicable or unspecified: Secondary | ICD-10-CM

## 2020-06-06 DIAGNOSIS — Z3A37 37 weeks gestation of pregnancy: Secondary | ICD-10-CM | POA: Insufficient documentation

## 2020-06-06 DIAGNOSIS — O99333 Smoking (tobacco) complicating pregnancy, third trimester: Secondary | ICD-10-CM | POA: Diagnosis not present

## 2020-06-06 DIAGNOSIS — O0990 Supervision of high risk pregnancy, unspecified, unspecified trimester: Secondary | ICD-10-CM

## 2020-06-06 NOTE — Procedures (Signed)
Catherine Luna 1995-02-25 [redacted]w[redacted]d  Fetus A Non-Stress Test Interpretation for 06/06/20  Indication: IUGR  Fetal Heart Rate A Mode: External Baseline Rate (A): 145 bpm Variability: Moderate Accelerations: 15 x 15 Decelerations: None Multiple birth?: No  Uterine Activity Mode: Palpation,Toco Contraction Frequency (min): ui Contraction Quality: Mild Resting Tone Palpated: Relaxed Resting Time: Adequate  Interpretation (Fetal Testing) Nonstress Test Interpretation: Reactive Overall Impression: Reassuring for gestational age Comments: Dr. Judeth Cornfield reviewed tracing

## 2020-06-07 LAB — CULTURE, BETA STREP (GROUP B ONLY): Strep Gp B Culture: POSITIVE — AB

## 2020-06-09 ENCOUNTER — Telehealth (HOSPITAL_COMMUNITY): Payer: Self-pay | Admitting: *Deleted

## 2020-06-09 ENCOUNTER — Encounter (HOSPITAL_COMMUNITY): Payer: Self-pay | Admitting: *Deleted

## 2020-06-09 NOTE — Telephone Encounter (Signed)
Preadmission screen  

## 2020-06-11 ENCOUNTER — Other Ambulatory Visit (HOSPITAL_COMMUNITY): Payer: Medicaid Other | Attending: Family Medicine

## 2020-06-11 ENCOUNTER — Encounter (HOSPITAL_COMMUNITY): Payer: Self-pay | Admitting: Anesthesiology

## 2020-06-11 ENCOUNTER — Other Ambulatory Visit: Payer: Self-pay

## 2020-06-11 ENCOUNTER — Encounter (HOSPITAL_COMMUNITY): Payer: Self-pay | Admitting: Family Medicine

## 2020-06-11 ENCOUNTER — Inpatient Hospital Stay (HOSPITAL_COMMUNITY)
Admission: AD | Admit: 2020-06-11 | Discharge: 2020-06-13 | DRG: 806 | Disposition: A | Discharge status: Home / Self Care | Payer: Medicaid Other | Attending: Obstetrics & Gynecology | Admitting: Obstetrics & Gynecology

## 2020-06-11 ENCOUNTER — Ambulatory Visit (INDEPENDENT_AMBULATORY_CARE_PROVIDER_SITE_OTHER): Payer: Medicaid Other | Admitting: Women's Health

## 2020-06-11 VITALS — BP 135/80 | HR 62 | Wt 153.0 lb

## 2020-06-11 DIAGNOSIS — O99820 Streptococcus B carrier state complicating pregnancy: Secondary | ICD-10-CM

## 2020-06-11 DIAGNOSIS — F17200 Nicotine dependence, unspecified, uncomplicated: Secondary | ICD-10-CM | POA: Diagnosis present

## 2020-06-11 DIAGNOSIS — O365990 Maternal care for other known or suspected poor fetal growth, unspecified trimester, not applicable or unspecified: Secondary | ICD-10-CM

## 2020-06-11 DIAGNOSIS — O99334 Smoking (tobacco) complicating childbirth: Secondary | ICD-10-CM | POA: Diagnosis present

## 2020-06-11 DIAGNOSIS — O99824 Streptococcus B carrier state complicating childbirth: Secondary | ICD-10-CM | POA: Diagnosis present

## 2020-06-11 DIAGNOSIS — Z20822 Contact with and (suspected) exposure to covid-19: Secondary | ICD-10-CM | POA: Diagnosis present

## 2020-06-11 DIAGNOSIS — O99333 Smoking (tobacco) complicating pregnancy, third trimester: Secondary | ICD-10-CM

## 2020-06-11 DIAGNOSIS — F1290 Cannabis use, unspecified, uncomplicated: Secondary | ICD-10-CM | POA: Diagnosis present

## 2020-06-11 DIAGNOSIS — O0990 Supervision of high risk pregnancy, unspecified, unspecified trimester: Secondary | ICD-10-CM

## 2020-06-11 DIAGNOSIS — R87610 Atypical squamous cells of undetermined significance on cytologic smear of cervix (ASC-US): Secondary | ICD-10-CM

## 2020-06-11 DIAGNOSIS — O134 Gestational [pregnancy-induced] hypertension without significant proteinuria, complicating childbirth: Secondary | ICD-10-CM | POA: Diagnosis present

## 2020-06-11 DIAGNOSIS — Z3A37 37 weeks gestation of pregnancy: Secondary | ICD-10-CM

## 2020-06-11 DIAGNOSIS — O99324 Drug use complicating childbirth: Secondary | ICD-10-CM | POA: Diagnosis present

## 2020-06-11 DIAGNOSIS — O98819 Other maternal infectious and parasitic diseases complicating pregnancy, unspecified trimester: Secondary | ICD-10-CM | POA: Insufficient documentation

## 2020-06-11 DIAGNOSIS — B951 Streptococcus, group B, as the cause of diseases classified elsewhere: Secondary | ICD-10-CM | POA: Diagnosis present

## 2020-06-11 DIAGNOSIS — O139 Gestational [pregnancy-induced] hypertension without significant proteinuria, unspecified trimester: Secondary | ICD-10-CM | POA: Diagnosis present

## 2020-06-11 DIAGNOSIS — O365930 Maternal care for other known or suspected poor fetal growth, third trimester, not applicable or unspecified: Secondary | ICD-10-CM | POA: Diagnosis present

## 2020-06-11 DIAGNOSIS — R87810 Cervical high risk human papillomavirus (HPV) DNA test positive: Secondary | ICD-10-CM

## 2020-06-11 DIAGNOSIS — F1220 Cannabis dependence, uncomplicated: Secondary | ICD-10-CM

## 2020-06-11 LAB — CBC
HCT: 38.2 % (ref 36.0–46.0)
Hemoglobin: 13.2 g/dL (ref 12.0–15.0)
MCH: 30.8 pg (ref 26.0–34.0)
MCHC: 34.6 g/dL (ref 30.0–36.0)
MCV: 89.3 fL (ref 80.0–100.0)
Platelets: 173 10*3/uL (ref 150–400)
RBC: 4.28 MIL/uL (ref 3.87–5.11)
RDW: 12.4 % (ref 11.5–15.5)
WBC: 9.5 10*3/uL (ref 4.0–10.5)
nRBC: 0 % (ref 0.0–0.2)

## 2020-06-11 LAB — RESP PANEL BY RT-PCR (FLU A&B, COVID) ARPGX2
Influenza A by PCR: NEGATIVE
Influenza B by PCR: NEGATIVE
SARS Coronavirus 2 by RT PCR: NEGATIVE

## 2020-06-11 LAB — COMPREHENSIVE METABOLIC PANEL
ALT: 24 U/L (ref 0–44)
AST: 24 U/L (ref 15–41)
Albumin: 3 g/dL — ABNORMAL LOW (ref 3.5–5.0)
Alkaline Phosphatase: 126 U/L (ref 38–126)
Anion gap: 9 (ref 5–15)
BUN: 10 mg/dL (ref 6–20)
CO2: 22 mmol/L (ref 22–32)
Calcium: 8.6 mg/dL — ABNORMAL LOW (ref 8.9–10.3)
Chloride: 105 mmol/L (ref 98–111)
Creatinine, Ser: 0.79 mg/dL (ref 0.44–1.00)
GFR, Estimated: >60 mL/min (ref >60)
Glucose, Bld: 70 mg/dL (ref 70–99)
Potassium: 4.1 mmol/L (ref 3.5–5.1)
Sodium: 136 mmol/L (ref 135–145)
Total Bilirubin: 0.6 mg/dL (ref 0.3–1.2)
Total Protein: 6 g/dL — ABNORMAL LOW (ref 6.5–8.1)

## 2020-06-11 LAB — PROTEIN / CREATININE RATIO, URINE
Creatinine, Urine: 66.4 mg/dL
Total Protein, Urine: <6 mg/dL

## 2020-06-11 LAB — TYPE AND SCREEN
ABO/RH(D): A POS
Antibody Screen: NEGATIVE

## 2020-06-11 MED ORDER — EPHEDRINE 5 MG/ML INJ: 10.0000 mg | INTRAVENOUS | Status: DC | PRN

## 2020-06-11 MED ORDER — SODIUM CHLORIDE 0.9 % IV SOLN
5.0000 10*6.[IU] | Freq: Once | INTRAVENOUS | Status: AC
  Administered 2020-06-11: 5 10*6.[IU] via INTRAVENOUS
  Filled 2020-06-11: qty 5

## 2020-06-11 MED ORDER — ONDANSETRON HCL 4 MG/2ML IJ SOLN: 4.0000 mg | Freq: Four times a day (QID) | INTRAMUSCULAR | Status: DC | PRN

## 2020-06-11 MED ORDER — PHENYLEPHRINE 40 MCG/ML (10ML) SYRINGE FOR IV PUSH (FOR BLOOD PRESSURE SUPPORT): 80.0000 ug | PREFILLED_SYRINGE | INTRAVENOUS | Status: DC | PRN

## 2020-06-11 MED ORDER — ACETAMINOPHEN 325 MG PO TABS: 650.0000 mg | ORAL_TABLET | ORAL | Status: DC | PRN

## 2020-06-11 MED ORDER — OXYTOCIN BOLUS FROM INFUSION
333.0000 mL | Freq: Once | INTRAVENOUS | Status: AC
  Administered 2020-06-12: 333 mL via INTRAVENOUS

## 2020-06-11 MED ORDER — FENTANYL-BUPIVACAINE-NACL 0.5-0.125-0.9 MG/250ML-% EP SOLN
12.0000 mL/h | EPIDURAL | Status: DC | PRN
  Filled 2020-06-11: qty 250

## 2020-06-11 MED ORDER — PENICILLIN G POT IN DEXTROSE 60000 UNIT/ML IV SOLN
3.0000 10*6.[IU] | INTRAVENOUS | Status: DC
  Administered 2020-06-11 (×2): 3 10*6.[IU] via INTRAVENOUS
  Filled 2020-06-11 (×2): qty 50

## 2020-06-11 MED ORDER — MISOPROSTOL 50MCG HALF TABLET
ORAL_TABLET | ORAL | Status: AC
  Filled 2020-06-11: qty 1

## 2020-06-11 MED ORDER — LIDOCAINE HCL (PF) 1 % IJ SOLN
30.0000 mL | INTRAMUSCULAR | Status: AC | PRN
  Administered 2020-06-12: 30 mL via SUBCUTANEOUS
  Filled 2020-06-11: qty 30

## 2020-06-11 MED ORDER — LACTATED RINGERS IV SOLN: 500.0000 mL | Freq: Once | INTRAVENOUS | Status: DC

## 2020-06-11 MED ORDER — OXYTOCIN-SODIUM CHLORIDE 30-0.9 UT/500ML-% IV SOLN
2.5000 [IU]/h | INTRAVENOUS | Status: DC
  Filled 2020-06-11: qty 500

## 2020-06-11 MED ORDER — MISOPROSTOL 50MCG HALF TABLET
50.0000 ug | ORAL_TABLET | ORAL | Status: DC
  Administered 2020-06-11 (×3): 50 ug via BUCCAL
  Filled 2020-06-11 (×2): qty 1

## 2020-06-11 MED ORDER — LACTATED RINGERS IV SOLN: 500.0000 mL | INTRAVENOUS | Status: DC | PRN

## 2020-06-11 MED ORDER — SOD CITRATE-CITRIC ACID 500-334 MG/5ML PO SOLN: 30.0000 mL | ORAL | Status: DC | PRN

## 2020-06-11 MED ORDER — LACTATED RINGERS IV SOLN: INTRAVENOUS | Status: DC

## 2020-06-11 MED ORDER — FENTANYL CITRATE (PF) 100 MCG/2ML IJ SOLN
50.0000 ug | INTRAMUSCULAR | Status: DC | PRN
Start: 2020-06-11 — End: 2020-06-12
  Administered 2020-06-11 (×2): 100 ug via INTRAVENOUS
  Filled 2020-06-11 (×2): qty 2

## 2020-06-11 MED ORDER — DIPHENHYDRAMINE HCL 50 MG/ML IJ SOLN: 12.5000 mg | INTRAMUSCULAR | Status: DC | PRN

## 2020-06-11 NOTE — Progress Notes (Signed)
Patient ID: Ernie Hew, female   DOB: [birth year], 25 y.o.   MRN: [id number]  S/p cytotec x 3 doses; SROM @ 2100 for clear fluid; now breathing w ctx; PCN x 3 doses  BPs 141/84, 140/74 FHR 135-140s, +accels, occ mi variables Ctx q 2-3 mins Cx deferred (was 1-2/70/vtx -1 @ 2009)  IUP@37 .5wks FGR gHTN Cx unfavorable SROM  -Continue present management -Plan next cx check when indicated by discomfort or for next plan of care -Anticipate vag del  Arabella Merles CNM 06/11/2020 10:14 PM

## 2020-06-11 NOTE — Progress Notes (Signed)
Subjective:  Catherine Luna is a 25 y.o. G2P0010 at [redacted]w[redacted]d being seen today for ongoing prenatal care.  She is currently monitored for the following issues for this low-risk pregnancy and has Supervision of high risk pregnancy, antepartum; ASCUS with positive high risk HPV cervical; Current smoker; Pregnancy affected by fetal growth restriction; Tobacco smoking affecting pregnancy, antepartum, third trimester; and Group B streptococcal infection during pregnancy on their problem list.  Patient reports no complaints.  Contractions: Irregular. Vag. Bleeding: None.  Movement: Present. Denies leaking of fluid.   The following portions of the patient's history were reviewed and updated as appropriate: allergies, current medications, past family history, past medical history, past social history, past surgical history and problem list. Problem list updated.  Objective:   Vitals:   06/11/20 0901 06/11/20 0905  BP: (!) 143/98 135/80  Pulse: 60 62  Weight: 153 lb (69.4 kg)     Fetal Status:     Movement: Present     General:  Alert, oriented and cooperative. Patient is in no acute distress.  Skin: Skin is warm and dry. No rash noted.   Cardiovascular: Normal heart rate noted  Respiratory: Normal respiratory effort, no problems with respiration noted  Abdomen: Soft, gravid, appropriate for gestational age. Pain/Pressure: Present     Pelvic: Vag. Bleeding: None     Cervical exam deferred        Extremities: Normal range of motion.  Edema: Trace  Mental Status: Normal mood and affect. Normal behavior. Normal judgment and thought content.   Urinalysis:      Assessment and Plan:  Pregnancy: G2P0010 at [redacted]w[redacted]d  1. Supervision of high risk pregnancy, antepartum  2. Current smoker  3. ASCUS with positive high risk HPV cervical -cotest in one year  4. Tobacco smoking affecting pregnancy, antepartum, third trimester  5. Pregnancy affected by fetal growth restriction -EFW 2.8% on  06/06/2020 -pt advised again that immediate induction is warranted, pt verbalizes understanding and agrees with plan; pt will go home to pick up her hospital bag and then will be a direct admission to L&D -Dr. Alvester Morin notified and accepts patient as direct admission -EFM: reactive       -baseline: 140       -variability: moderate       -accels: present, 15x15       -decels: absent       -TOCO: few, irregular  6. [redacted] weeks gestation of pregnancy  Term labor symptoms and general obstetric precautions including but not limited to vaginal bleeding, contractions, leaking of fluid and fetal movement were reviewed in detail with the patient. I discussed the assessment and treatment plan with the patient. The patient was provided an opportunity to ask questions and all were answered. The patient agreed with the plan and demonstrated an understanding of the instructions. The patient was advised to call back or seek an in-person office evaluation/go to [facility]U at [facility] for any urgent or concerning symptoms. Please refer to After Visit Summary for other counseling recommendations.  Return for to [facility] for IOL immediately.   Ambert Virrueta, Odie Sera, NP

## 2020-06-11 NOTE — Anesthesia Preprocedure Evaluation (Deleted)
Anesthesia Evaluation  Patient identified by MRN, date of birth, ID band Patient awake    Reviewed: Allergy & Precautions, Patient's Chart, lab work & pertinent test results  Airway Mallampati: II  TM Distance: >3 FB     Dental   Pulmonary neg pulmonary ROS,    breath sounds clear to auscultation       Cardiovascular negative cardio ROS   Rhythm:Regular Rate:Normal     Neuro/Psych Seizures -,     GI/Hepatic negative GI ROS, Neg liver ROS,   Endo/Other  negative endocrine ROS  Renal/GU negative Renal ROS     Musculoskeletal   Abdominal   Peds  Hematology negative hematology ROS (+)   Anesthesia Other Findings   Reproductive/Obstetrics (+) Pregnancy                             Lab Results  Component Value Date   WBC 9.5 06/11/2020   HGB 13.2 06/11/2020   HCT 38.2 06/11/2020   MCV 89.3 06/11/2020   PLT 173 06/11/2020   Lab Results  Component Value Date   CREATININE 0.79 06/11/2020   BUN 10 06/11/2020   NA 136 06/11/2020   K 4.1 06/11/2020   CL 105 06/11/2020   CO2 22 06/11/2020    Anesthesia Physical Anesthesia Plan  ASA: II  Anesthesia Plan: Epidural   Post-op Pain Management:    Induction:   PONV Risk Score and Plan: 2 and Treatment may vary due to age or medical condition  Airway Management Planned: Natural Airway  Additional Equipment:   Intra-op Plan:   Post-operative Plan:   Informed Consent: I have reviewed the patients History and Physical, chart, labs and discussed the procedure including the risks, benefits and alternatives for the proposed anesthesia with the patient or authorized representative who has indicated his/her understanding and acceptance.       Plan Discussed with:   Anesthesia Plan Comments: (Discussed risks of epidural placement with patient at 2310 on 6/8 and pt declines epidural at this time.)       Anesthesia Quick  Evaluation

## 2020-06-11 NOTE — Progress Notes (Addendum)
LABOR PROGRESS NOTE  Catherine Luna is a 25 y.o. G2P0010 at [redacted]w[redacted]d admitted for IOL due to IUGR (EFW 2.8%)  Subjective: Pt starting to feel more contractions, about every 5 minutes. Feeling mostly in her lower back. Reports she just wants to try the Cytotec again and hold off on the FB at this time.  Objective: BP 133/76 (BP Location: Right Arm)   Pulse (!) 53   Temp 98.1 F (36.7 C) (Oral)   Resp 18   Ht 5\' 6"  (1.676 m)   Wt 69.6 kg   LMP 09/21/2019 (Approximate)   SpO2 99%   BMI 24.76 kg/m   Dilation: 1.5 Effacement (%): 70 Station: -1 Presentation: Vertex Exam by:: Dr. [email or web address] Fetal monitoring: Baseline: 135 bpm, Variability: Good {> 6 bpm), Accelerations: Reactive, and Decelerations: Absent (had a few intermittent variable decels a few hours ago) Uterine activity: Frequency: Every 3-5 minutes, but mild  Labs: Lab Results  Component Value Date   WBC 9.5 06/11/2020   HGB 13.2 06/11/2020   HCT 38.2 06/11/2020   MCV 89.3 06/11/2020   PLT 173 06/11/2020    Patient Active Problem List   Diagnosis Date Noted  . Group B streptococcal infection during pregnancy 06/11/2020  . Indication for care or intervention in labor or delivery 06/11/2020  . Pregnancy affected by fetal growth restriction 04/28/2020  . Tobacco smoking affecting pregnancy, antepartum, third trimester 04/28/2020  . ASCUS with positive high risk HPV cervical 01/24/2020  . Current smoker 01/24/2020  . Supervision of high risk pregnancy, antepartum 12/26/2019    Assessment / Plan: Induction of labor due to IUGR (EFW 2.8%)  #Labor: No cervical change since initial dose of Cytotec. Some decent of head. Will give 2nd dose of Cytotec. Consider FB at next check. #gHTN: Had two elevated Bps four hours apart. Will continue to monitor. PEC labs still pending. Pt is asymptomatic.  #Fetal Wellbeing:  Category I #Pain Control: IV pain meds per pt request, would like to avoid epidural #ID: GBS positive,  PCN #Anticipated MOD: NSVD   12/28/2019 MD, PGY-1 Family Medicine Resident, [facility] Faculty Teaching Service  06/11/2020, 4:19 PM

## 2020-06-11 NOTE — H&P (Addendum)
LABOR AND DELIVERY ADMISSION HISTORY AND PHYSICAL NOTE  Catherine Luna is a 25 y.o. female G2P0010 with IUP at 1w5dby LMP presenting for IOL due to IUGR (EFW 2.8%). She had an UKoreaon 6/3 that confirmed severe FGR and was encouraged to come in for induction the same day, but pt was hesitant to be induced and wanted to wait. She was seen in clinic today and was agreeable to coming in for induction today.  She reports positive fetal movement. She denies leakage of fluid, vaginal bleeding, or contractions. She reports that she has been using MJ throughout pregnancy, but has not smoked it for the last two weeks. She has also been vaping throughout pregnancy, and continues to vape, but says she has cut down.  She plans on breast feeding. Her contraception plan is: TBD, considering POPs.  [facility] , [facility], normal anatomy, cephalic presentation, anterior placenta, 2.8%ile, EFW [id number] Pregnancy complications:  - IUGR - tobacco use during pregnancy - Marijuana use during pregnancy - ASCUS w/ positive high risk HPV  Past Medical History: Past Medical History:  Diagnosis Date  . [place]    Past Surgical History: History reviewed. No pertinent surgical history.  Obstetrical History: OB History    Gravida  2   Para  0   Term      Preterm      AB  1   Living        SAB      IAB  1   Ectopic      Multiple      Live Births              Social History: Social History   Socioeconomic History  . Marital status: Single    Spouse name: Not on file  . Number of children: 0  . Years of education: Not on file  . Highest education level: Not on file  Occupational History  . Occupation: SUB TEACHER/WAITRESS  Tobacco Use  . Smoking status: Never Smoker  . Smokeless tobacco: Never Used  Vaping Use  . Vaping Use: Every day  . Substances: Nicotine, Flavoring  Substance and Sexual Activity  .  Alcohol use: Not Currently  . Drug use: Yes    Types: Marijuana    Comment: Daily since 2015  . Sexual activity: Yes  Other Topics Concern  . Not on file  Social History Narrative  . Not on file   Social Determinants of Health   Financial Resource Strain: Not on file  Food Insecurity: Not on file  Transportation Needs: Not on file  Physical Activity: Not on file  Stress: Not on file  Social Connections: Not on file    Family History: Family History  Problem Relation Age of Onset  . Alcohol abuse Mother   . Heart disease Mother   . Hypertension Mother   . Anxiety disorder Sister   . Depression Sister   . Asthma Brother   . Heart disease Maternal Grandfather     Allergies: No Known Allergies  Medications Prior to Admission  Medication Sig Dispense Refill Last Dose  . Blood Pressure Monitoring (BLOOD PRESSURE KIT) DEVI 1 kit by Does not apply route once a week. Check Blood Pressure regularly and record readings into the Babyscripts App.  Large Cuff.  DX O90.0 1 each 0   . hydrocortisone (ANUSOL-HC) 2.5 % rectal cream Place 1 application rectally 2 (two) times  daily. 30 g 0   . hydrocortisone (ANUSOL-HC) 25 MG suppository Place 1 suppository (25 mg total) rectally 2 (two) times daily. (Patient not taking: Reported on 05/16/2020) 12 suppository 1   . metroNIDAZOLE (FLAGYL) 500 MG tablet Take 1 tablet (500 mg total) by mouth 2 (two) times daily. (Patient not taking: No sig reported) 14 tablet 2   . Prenat-FeCbn-FeAsp-Meth-FA-DHA (PRENATE MINI) 18-0.6-0.4-350 MG CAPS Take 1 capsule by mouth daily. 30 capsule 12   . terconazole (TERAZOL 7) 0.4 % vaginal cream Place 1 applicator vaginally at bedtime. (Patient not taking: No sig reported) 45 g 0      Review of Systems  All systems reviewed and negative except as stated in HPI  Physical Exam Blood pressure 126/79, pulse (!) 48, temperature 98.1 F (36.7 C), temperature source Oral, resp. rate 18, height $RemoveBe'5\' 6"'jmOzlCJgR$  (1.676 m), weight  69.6 kg, last menstrual period 09/21/2019. General appearance: alert, oriented, NAD Lungs: normal respiratory effort Heart: regular rate Abdomen: soft, non-tender; gravid, leopolds  Extremities: No calf swelling or tenderness Presentation: cephalic by exam  Fetal monitoring: Baseline: 145 bpm, Variability: Good {> 6 bpm), Accelerations: Reactive and Decelerations: Absent Uterine activity: Frequency: Every 3-6 minutes  Dilation: 1.5 Effacement (%): 70 Station: -2 Exam by:: Krystal Eaton RN  Prenatal labs: ABO, Rh: --/--/PENDING (06/08 1144) Antibody: PENDING (06/08 1144) Rubella: 1.60 (12/23 1433) RPR: Non Reactive (04/08 1017)  HBsAg: Negative (12/23 1433)  HIV: Non Reactive (04/08 1017)  GC/Chlamydia: negative GBS: Positive/-- (05/31 0957)  2-hr GTT: 80/136/67 Genetic screening:  normal Anatomy US: normal  Prenatal Transfer Tool  Maternal Diabetes: No Genetic Screening: Normal Maternal Ultrasounds/Referrals: IUGR Fetal Ultrasounds or other Referrals:  None Maternal Substance Abuse:  Yes:  Type: Smoker, Marijuana Significant Maternal Medications:  None Significant Maternal Lab Results: Group B Strep positive  Results for orders placed or performed during the hospital encounter of 06/11/20 (from the past 24 hour(s))  Resp Panel by RT-PCR (Flu A&B, Covid) Nasopharyngeal Swab   Collection Time: 06/11/20 11:44 AM   Specimen: Nasopharyngeal Swab; Nasopharyngeal(NP) swabs in vial transport medium  Result Value Ref Range   SARS Coronavirus 2 by RT PCR NEGATIVE NEGATIVE   Influenza A by PCR NEGATIVE NEGATIVE   Influenza B by PCR NEGATIVE NEGATIVE  CBC   Collection Time: 06/11/20 11:44 AM  Result Value Ref Range   WBC 9.5 4.0 - 10.5 K/uL   RBC 4.28 3.87 - 5.11 MIL/uL   Hemoglobin 13.2 12.0 - 15.0 g/dL   HCT 38.2 36.0 - 46.0 %   MCV 89.3 80.0 - 100.0 fL   MCH 30.8 26.0 - 34.0 pg   MCHC 34.6 30.0 - 36.0 g/dL   RDW 12.4 11.5 - 15.5 %   Platelets 173 150 - 400 K/uL   nRBC  0.0 0.0 - 0.2 %  Type and screen   Collection Time: 06/11/20 11:44 AM  Result Value Ref Range   ABO/RH(D) PENDING    Antibody Screen PENDING    Sample Expiration      06/14/2020,2359 Performed at [facility], [address] [place], [place] [address]     Patient Active Problem List   Diagnosis Date Noted  . Group B streptococcal infection during pregnancy 06/11/2020  . Indication for care or intervention in labor or delivery 06/11/2020  . Pregnancy affected by fetal growth restriction 04/28/2020  . Tobacco smoking affecting pregnancy, antepartum, third trimester 04/28/2020  . ASCUS with positive high risk HPV cervical 01/24/2020  . Current  smoker 01/24/2020  . Supervision of high risk pregnancy, antepartum 12/26/2019    Assessment: Francia Verry is a 24 y.o. G2P0010 at 60w5dhere for IOL due to IUGR  #Induction of Labor: IUGR with EFW 2.8%. Will start induction with cytotec. Counseled on foley bulb as well, pt willing to consider at next check. Possible etiology is pt's continue tobacco/MJ use during pregnancy #Elevated BP: Initial BP of 145/83. F/u pressures have been appropriate. Pt is asymptomatic. Will check PEC labs. #ASCUS w/ high risk HPV: cotesting in 1 year #Fetal Wellbeing:  Category I #Pain Control: per pt request #GBS/ID: positive, penicillin given #COVID: swab pending #MOF: Breast #MOC: TBD, thinking POPs #Circ: n/a #Anticipated MOD: NSVD  WAudree Bane MD, PGY-1 Family Medicine Resident, [facility] Teaching Service  06/11/2020, 1:36 PM   I saw and evaluated the patient. I agree with the findings and the plan of care as documented in the resident's note.  JSharene Skeans MD OSouthwood Psychiatric HospitalFamily Medicine Fellow, FMonroe County Surgical Center LLCfor WWoman[facility] CPoint [place]

## 2020-06-11 NOTE — Patient Instructions (Signed)
Maternity Assessment Unit (MAU)  The Maternity Assessment Unit (MAU) is located at the [facility]. The address is: [address], [address], [place], [place] [address]. Please see map below for additional directions.    The Maternity Assessment Unit is designed to help you during your pregnancy, and for up to 6 weeks after delivery, with any pregnancy- or postpartum-related emergencies, if you think you are in labor, or if your water has broken. For example, if you experience nausea and vomiting, vaginal bleeding, severe abdominal or pelvic pain, elevated blood pressure or other problems related to your pregnancy or postpartum time, please come to the Maternity Assessment Unit for assistance.        

## 2020-06-12 ENCOUNTER — Encounter (HOSPITAL_COMMUNITY): Payer: Self-pay | Admitting: Family Medicine

## 2020-06-12 LAB — RPR: RPR Ser Ql: NONREACTIVE

## 2020-06-12 MED ORDER — COCONUT OIL OIL
1.0000 "application " | TOPICAL_OIL | Status: DC | PRN
  Administered 2020-06-12: 1 via TOPICAL

## 2020-06-12 MED ORDER — TETANUS-DIPHTH-ACELL PERTUSSIS 5-2.5-18.5 LF-MCG/0.5 IM SUSY: 0.5000 mL | PREFILLED_SYRINGE | Freq: Once | INTRAMUSCULAR | Status: DC

## 2020-06-12 MED ORDER — DIBUCAINE (PERIANAL) 1 % EX OINT: 1.0000 "application " | TOPICAL_OINTMENT | CUTANEOUS | Status: DC | PRN

## 2020-06-12 MED ORDER — MEASLES, MUMPS & RUBELLA VAC IJ SOLR: 0.5000 mL | Freq: Once | INTRAMUSCULAR | Status: DC

## 2020-06-12 MED ORDER — IBUPROFEN 600 MG PO TABS
600.0000 mg | ORAL_TABLET | Freq: Four times a day (QID) | ORAL | Status: DC
  Administered 2020-06-12 – 2020-06-13 (×6): 600 mg via ORAL
  Filled 2020-06-12 (×6): qty 1

## 2020-06-12 MED ORDER — SENNOSIDES-DOCUSATE SODIUM 8.6-50 MG PO TABS
2.0000 | ORAL_TABLET | ORAL | Status: DC
  Administered 2020-06-12 – 2020-06-13 (×2): 2 via ORAL
  Filled 2020-06-12 (×2): qty 2

## 2020-06-12 MED ORDER — ACETAMINOPHEN 325 MG PO TABS: 650.0000 mg | ORAL_TABLET | ORAL | Status: DC | PRN

## 2020-06-12 MED ORDER — ONDANSETRON HCL 4 MG/2ML IJ SOLN: 4.0000 mg | INTRAMUSCULAR | Status: DC | PRN

## 2020-06-12 MED ORDER — ZOLPIDEM TARTRATE 5 MG PO TABS: 5.0000 mg | ORAL_TABLET | Freq: Every evening | ORAL | Status: DC | PRN

## 2020-06-12 MED ORDER — PRENATAL MULTIVITAMIN CH
1.0000 | ORAL_TABLET | Freq: Every day | ORAL | Status: DC
  Administered 2020-06-12 – 2020-06-13 (×2): 1 via ORAL
  Filled 2020-06-12 (×2): qty 1

## 2020-06-12 MED ORDER — BENZOCAINE-MENTHOL 20-0.5 % EX AERO
1.0000 "application " | INHALATION_SPRAY | CUTANEOUS | Status: DC | PRN
  Administered 2020-06-12: 1 via TOPICAL
  Filled 2020-06-12: qty 56

## 2020-06-12 MED ORDER — SIMETHICONE 80 MG PO CHEW: 80.0000 mg | CHEWABLE_TABLET | ORAL | Status: DC | PRN

## 2020-06-12 MED ORDER — ONDANSETRON HCL 4 MG PO TABS: 4.0000 mg | ORAL_TABLET | ORAL | Status: DC | PRN

## 2020-06-12 MED ORDER — WITCH HAZEL-GLYCERIN EX PADS: 1.0000 "application " | MEDICATED_PAD | CUTANEOUS | Status: DC | PRN

## 2020-06-12 MED ORDER — DIPHENHYDRAMINE HCL 25 MG PO CAPS: 25.0000 mg | ORAL_CAPSULE | Freq: Four times a day (QID) | ORAL | Status: DC | PRN

## 2020-06-12 MED ORDER — OXYCODONE HCL 5 MG PO TABS: 5.0000 mg | ORAL_TABLET | ORAL | Status: DC | PRN

## 2020-06-12 NOTE — Discharge Instructions (Signed)

## 2020-06-12 NOTE — Clinical Social Work Maternal (Signed)
CLINICAL SOCIAL WORK MATERNAL/CHILD NOTE  Patient Details  Name: Catherine Luna MRN: [id number] Date of Birth: 09/25/1995  Date:  06/12/2020  Clinical Social Worker Initiating Note:  Dennison Mcdaid, MSW, LCSWA Date/Time: Initiated:  06/12/20/0230     Child's Name:  Catherine Luna   Biological Parents:  Mother, Father (Alvin Luna 10/24/1993)   Need for Interpreter:  None   Reason for Referral:  Current Substance Use/Substance Use During Pregnancy     Address:  [address] [place] [address]    Phone number:  [phone] (home)     Additional phone number:   Household Members/Support Persons (HM/SP):   Household Member/Support Person 1   HM/SP Name Relationship DOB or Age  HM/SP -1 Alvin Luna Significant Other 10/24/1993  HM/SP -2        HM/SP -3        HM/SP -4        HM/SP -5        HM/SP -6        HM/SP -7        HM/SP -8          Natural Supports (not living in the home):  Immediate Family   Professional Supports: None   Employment: Full-time   Type of Work: [employer]   Education:  College graduate   Homebound arranged:    Financial Resources:  Medicaid   Other Resources:  WIC, Food Stamps     Cultural/Religious Considerations Which May Impact Care:  Strengths:  Ability to meet basic needs  , Pediatrician chosen, Home prepared for child     Psychotropic Medications:         Pediatrician:    [place] area  Pediatrician List:   [facility]  [place]    [place]    [place]    [place]    [place]      Pediatrician Fax Number:    Risk Factors/Current Problems:  Substance Use     Cognitive State:  Alert  , Insightful  , Linear Thinking  , Goal Oriented     Mood/Affect:  Interested  , Calm  , Bright     CSW Assessment: CSW consulted for THC use. CSW met with MOB to complete assessment and offer support. CSW introduced self and role. CSW observed  infant sleeping in bassinet and FOB on couch. MOB declined to have FOB leave the room for assessment, stating all information can be discussed with him present. CSW informed MOB of reason for consult. MOB was understanding and reported she last smoked THC about 2 to 3 weeks ago. MOB disclosed she smoked once a day to assist with nausea symptoms. MOB shared she that had a hard time quitting. MOB denies using any additional substances during pregnancy. CSW informed MOB of the hospital drug screen policy. MOB aware that infant's UDS is negative and the CDS will be followed. CSW informed parents that a CPS report will be made if infant's CDS test positive. MOB expressed understanding and denied any additional questions regarding the policy.  CSW assessed MOB current emotions. MOB smiled as she stated she is "tired, but good." MOB reported that aside from some sickness, she had a good pregnancy overall. MOB denies having any mental health history, identifying FOB and her mother as being supportive. MOB denies any current SI or HI.  CSW provided education regarding the baby blues period versus PPD and provided resources. CSW provided the   New Mom Checklist and encouraged MOB to self evaluate and contact a medical professional if symptoms are noted at any time. MOB was understanding and receptive to resources.   CSW provided review of Sudden Infant Death Syndrome (SIDS) precautions. MOB reported she has everything needed for infant, including a crib and car seat. MOB denies any barriers to care. MOB reported she has no additional needs at this time.   CSW will continue to follow CDS and make a CPS report if warranted. CSW identifies no further need for intervention and no barriers to discharge at this time.  CSW Plan/Description:  No Further Intervention Required/No Barriers to Discharge, Perinatal Mood and Anxiety Disorder (PMADs) Education, CSW Will Continue to Monitor Umbilical Cord Tissue Drug Screen Results  and Make Report if Warranted, Child Protective Service Report  , Hospital Drug Screen Policy Information, Sudden Infant Death Syndrome (SIDS) Education, Other Information/Referral to Community Resources    Theona Muhs J Malaney Mcbean, LCSWA 06/12/2020, 2:45 PM 

## 2020-06-12 NOTE — Progress Notes (Signed)
POSTPARTUM PROGRESS NOTE  Post Partum Day 1  Subjective:  Catherine Luna is a 25 y.o. G2P1011 s/p SVD at [redacted]w[redacted]d.  She reports she is doing well. No acute events overnight. She denies any problems with ambulating, voiding or po intake. Denies nausea or vomiting.  Pain is well controlled.  Lochia is appropriate.  Objective: Blood pressure 133/87, pulse (!) 52, temperature 97.7 F (36.5 C), resp. rate 16, height 5\' 6"  (1.676 m), weight 69.6 kg, last menstrual period 09/21/2019, SpO2 100 %, unknown if currently breastfeeding.  Physical Exam:  General: alert, cooperative and no distress Chest: no respiratory distress Heart:regular rate, distal pulses intact Abdomen: soft, nontender,  Uterine Fundus: firm, appropriately tender DVT Evaluation: No calf swelling or tenderness Extremities: No peripheral edema Skin: warm, dry  Recent Labs    06/11/20 1144  HGB 13.2  HCT 38.2    Assessment/Plan: Catherine Luna is a 25 y.o. G2P1011 s/p SVD at [redacted]w[redacted]d   #PPD#1 - Doing well  Routine postpartum care #gHTN: No meds during pregnancy, preE labs normal. Has had mildly elevated pressures since delivery late last night - 133/87, 130/77, 135/82. Discussed starting Norvasc 5 mg, patient would like to monitor BP longer today and if they continue to be elevated, is amenable to starting medication.  #Contraception: POPs #Feeding: breast #Dispo: Plan for discharge tomorrow, 6/10.   LOS: 1 day

## 2020-06-12 NOTE — Discharge Summary (Addendum)
Postpartum Discharge Summary      Patient Name: Catherine Luna DOB: 10-30-1995 MRN: [id number]  Date of admission: 06/11/2020 Delivery date:06/11/2020  Delivering provider: Serita Grammes D  Date of discharge: 06/13/2020  Admitting diagnosis: Indication for care or intervention in labor or delivery [O75.9] Intrauterine pregnancy: [redacted]w[redacted]d     Secondary diagnosis:  Active Problems:   Current smoker   Pregnancy affected by fetal growth restriction   Group B streptococcal infection during pregnancy   Indication for care or intervention in labor or delivery   Gestational hypertension  Additional problems: none    Discharge diagnosis: Term Pregnancy Delivered and Gestational Hypertension                                              Post partum procedures: none Augmentation: Cytotec Complications: None  Hospital course: Induction of Labor With Vaginal Delivery   25 y.o. yo G2P0010 at [redacted]w[redacted]d was admitted to the hospital 06/11/2020 for induction of labor.  Indication for induction:  FGR (EFW 2.8%, AC<1%) dx on 06/06/20, at which point IOL was recommended . In the office on 06/11/20 she had an elevated BP as well, followed by confirmatory mild range elevations for gHTN with neg pre-e labs and no symptoms once she arrived to [facility]. Patient had an uncomplicated labor course receiving cytotec x 3 doses and adequate GBS ppx prior to progressing to vag del. Membrane Rupture Time/Date: 9:00 PM ,06/11/2020   Delivery Method:Vaginal, Spontaneous  Episiotomy: None  Lacerations:  Labial  Details of delivery can be found in separate delivery note.  Patient had a routine postpartum course. She had a few elevated BP readings as high 140s/80s after delivery. Shared decision making done with patient regarding starting antihypertensive, patient opted to monitor BP longer before starting medication. BP started to improve so she was not started on an antihypertensive. Patient is discharged home 06/13/20.  Newborn  Data: Birth date:06/11/2020  Birth time:11:55 PM  Gender:Female  Living status:Living  Apgars:8 ,9  Weight:2.546 kg (5lb 9.8oz)  Magnesium Sulfate received: No BMZ received: No Rhophylac:N/A MMR:N/A T-DaP:Given prenatally Flu: No Transfusion:No  Physical exam  Vitals:   06/12/20 1000 06/12/20 1358 06/12/20 2152 06/13/20 0509  BP: 125/75 127/83 132/84 117/75  Pulse: (!) 47 (!) 48 (!) 50 (!) 48  Resp: $Remo'18 16 16 18  'vqRTL$ Temp: 97.6 F (36.4 C) 98 F (36.7 C) 98.1 F (36.7 C) 97.9 F (36.6 C)  TempSrc: Oral Oral Oral Oral  SpO2: 99% 98% 99% 99%  Weight:      Height:       General: alert, cooperative, and no distress Lochia: appropriate Uterine Fundus: firm Incision: N/A DVT Evaluation: No significant calf/ankle edema. Labs: Lab Results  Component Value Date   WBC 9.5 06/11/2020   HGB 13.2 06/11/2020   HCT 38.2 06/11/2020   MCV 89.3 06/11/2020   PLT 173 06/11/2020   CMP Latest Ref Rng & Units 06/11/2020  Glucose 70 - 99 mg/dL 70  BUN 6 - 20 mg/dL 10  Creatinine 0.44 - 1.00 mg/dL 0.79  Sodium 135 - 145 mmol/L 136  Potassium 3.5 - 5.1 mmol/L 4.1  Chloride 98 - 111 mmol/L 105  CO2 22 - 32 mmol/L 22  Calcium 8.9 - 10.3 mg/dL 8.6(L)  Total Protein 6.5 - 8.1 g/dL 6.0(L)  Total Bilirubin 0.3 - 1.2 mg/dL 0.6  Alkaline  Phos 38 - 126 U/L 126  AST 15 - 41 U/L 24  ALT 0 - 44 U/L 24   Edinburgh Score: No flowsheet data found.   After visit meds:  Allergies as of 06/13/2020   No Known Allergies      Medication List     STOP taking these medications    hydrocortisone 25 MG suppository Commonly known as: ANUSOL-HC   metroNIDAZOLE 500 MG tablet Commonly known as: FLAGYL   terconazole 0.4 % vaginal cream Commonly known as: TERAZOL 7       TAKE these medications    acetaminophen 325 MG tablet Commonly known as: Tylenol Take 2 tablets (650 mg total) by mouth every 4 (four) hours as needed (for pain scale < 4).   Blood Pressure Kit Devi 1 kit by Does not  apply route once a week. Check Blood Pressure regularly and record readings into the Babyscripts App.  Large Cuff.  DX O90.0   coconut oil Oil Apply 1 application topically as needed.   hydrocortisone 2.5 % rectal cream Commonly known as: Anusol-HC Place 1 application rectally 2 (two) times daily.   ibuprofen 600 MG tablet Commonly known as: ADVIL Take 1 tablet (600 mg total) by mouth every 6 (six) hours.   Prenate Mini 18-0.6-0.4-350 MG Caps Take 1 capsule by mouth daily.         Discharge home in stable condition Infant Feeding: Breast Infant Disposition:home with mother Discharge instruction: per After Visit Summary and Postpartum booklet. Activity: Advance as tolerated. Pelvic rest for 6 weeks.  Diet: routine diet Future Appointments: Future Appointments  Date Time Provider [place]  06/18/2020 10:00 AM [place] None  07/10/2020  9:15 AM Leftwich-Kirby, Kathie Dike, CNM [facility] None   Follow up Visit: Myrtis Ser, CNM  P [facility] Please schedule this patient for Postpartum visit in: 4 weeks with the following provider: Any provider  In-Person  For C/S patients schedule nurse incision check in weeks 2 weeks: no  High risk pregnancy complicated by: gHTN, FGR  Delivery mode:  SVD  Anticipated Birth Control:  POPs  PP Procedures needed: BP check 1wk  Schedule Integrated [place] visit: no  06/13/2020 Christin Fudge, CNM

## 2020-06-12 NOTE — Lactation Note (Signed)
This note was copied from a baby's chart. Lactation Consultation Note  Patient Name: Catherine Luna Date: 06/12/2020 Reason for consult: Follow-up assessment;Mother's request;Difficult latch;Primapara;1st time breastfeeding;Early term 37-38.6wks;Infant < 6lbs;Other (Comment)  Age: 25 hrs   Mom assisted with flange size with DEBP. Mom stated with 24 flange some pain around the areola. LC switched to 21 flange, Mom stated better fit.   LC assisted Mom with latching, working on flanging out her lips, using breast compression and noting signs of milk transfer.   Infant has thick labial attachment and required adjustment of top lips and chin tug for bottom to keep them flanged outward during the latch.   LC went over how to reduce calorie loss for LPTI including keeping her STS, hat on all times and total feeding under 30 minutes.   Plan 1. To feed based on cues 8-12x in 24 hr period no more than 3 hrs without an attempt. Mom to offer both breasts and look for signs of milk transfer with breast compression.  2. Mom to supplement with EBM first with spoon/ when volume increases with paced bottle feeding with slow flow nipple provided.  3. Mom to pump using DEBP q 3 hrs for 15 minutes  4. I and O sheet reviewed.   5 LC brochure of inpatient and outpatient services reviewed.  All questions answered at the end of the visit.   Maternal Data Has patient been taught Hand Expression?: Yes Does the patient have breastfeeding experience prior to this delivery?: No  Feeding Mother's Current Feeding Choice: Breast Milk  LATCH Score Latch: Repeated attempts needed to sustain latch, nipple held in mouth throughout feeding, stimulation needed to elicit sucking reflex.  Audible Swallowing: None  Type of Nipple: Everted at rest and after stimulation (Left side shorter shaft)  Comfort (Breast/Nipple): Soft / non-tender  Hold (Positioning): No assistance needed to correctly position  infant at breast.  LATCH Score: 7   Lactation Tools Discussed/Used Tools: Pump;Flanges;Coconut oil Flange Size: 21 Breast pump type: Double-Electric Breast Pump Pump Education: Setup, frequency, and cleaning;Milk Storage Reason for Pumping: increase stimulation Pumping frequency: every 3 hrs for 15 minutes  Interventions Interventions: Breast feeding basics reviewed;Breast compression;Assisted with latch;Adjust position;Skin to skin;Support pillows;Breast massage;Position options;Hand express;Expressed milk;Education;Pre-pump if needed;Coconut oil;Reverse pressure  Discharge Pump: Manual;Personal WIC Program: Yes  Consult Status Consult Status: Follow-up Date: 06/13/20 Follow-up type: In-patient    Catherine Luna  Catherine Luna 06/12/2020, 5:09 PM

## 2020-06-12 NOTE — Lactation Note (Signed)
This note was copied from a baby's chart. Lactation Consultation Note Assisted in latching. Mom has semi flat nipples. Reverse pressure helpful to soften areola tissue for better latching. Baby off and on breast suckling at intervals.  Patient Name: Catherine Luna Date: 06/12/2020 Reason for consult: L&D Initial assessment;Primapara;Early term 37-38.6wks Age:25 hours  Maternal Data    Feeding    LATCH Score Latch: Repeated attempts needed to sustain latch, nipple held in mouth throughout feeding, stimulation needed to elicit sucking reflex.  Audible Swallowing: None  Type of Nipple: Flat  Comfort (Breast/Nipple): Soft / non-tender  Hold (Positioning): Full assist, staff holds infant at breast  LATCH Score: 4   Lactation Tools Discussed/Used    Interventions Interventions: Breast feeding basics reviewed;Position options;Support pillows;Skin to skin;Reverse pressure;Breast compression;Adjust position  Discharge    Consult Status Consult Status: Follow-up Date: 06/12/20 Follow-up type: In-patient    Charyl Dancer 06/12/2020, 12:48 AM

## 2020-06-12 NOTE — Lactation Note (Signed)
This note was copied from a baby's chart. Lactation Consultation Note Attempted to see mom. Everyone in room sleeping. Mom woke up when [facility] came into rm. And placed pump kit on counter. Mom asked if we could set it up later. LC stated OK.  Patient Name: Girl Berkley Wrightsman [id number] Date: 06/12/2020   Age:25 hours  Maternal Data    Feeding    LATCH Score Latch:  (observed baby on the nipple, few sucks but no swallows)                  Lactation Tools Discussed/Used    Interventions Interventions: Skin to skin;Breast massage;Hand express  Discharge    Consult Status      Theodoro Kalata 06/12/2020, 5:36 AM

## 2020-06-13 ENCOUNTER — Inpatient Hospital Stay (HOSPITAL_COMMUNITY): Payer: Medicaid Other

## 2020-06-13 LAB — BIRTH TISSUE RECOVERY COLLECTION (PLACENTA DONATION)

## 2020-06-13 MED ORDER — IBUPROFEN 600 MG PO TABS: 600.0000 mg | ORAL_TABLET | Freq: Four times a day (QID) | ORAL | 0 refills | Status: AC

## 2020-06-13 MED ORDER — COCONUT OIL OIL: 1.0000 "application " | TOPICAL_OIL | 0 refills | Status: AC | PRN

## 2020-06-13 MED ORDER — ACETAMINOPHEN 325 MG PO TABS: 650.0000 mg | ORAL_TABLET | ORAL | Status: AC | PRN

## 2020-06-13 NOTE — Lactation Note (Signed)
This note was copied from a baby's chart. Lactation Consultation Note  Patient Name: Catherine Luna [id number] Date: 06/13/2020 Reason for consult: Follow-up assessment;Primapara;1st time breastfeeding;Early term 37-38.6wks;Infant weight loss;Other (Comment);Nipple pain/trauma;Infant < 6lbs (6 % weight loss , 2nd LC visit for latch assessment/ Latch Score 8 , baby fed 17 mins / worked on depth and positioning, per mom  comfortable. see D/C education below. repeat Bili check and weight at 12N per RN) Age:67 hours LC sent a request in Epic for Sherlean Foot, IBCLC to F/U with this mom for F/U assessment.   Maternal Data Has patient been taught Hand Expression?: Yes (mom easily hand expressed from both breast)  Feeding Mother's Current Feeding Choice: Breast Milk  LATCH Score Latch: Grasps breast easily, tongue down, lips flanged, rhythmical sucking.  Audible Swallowing: Spontaneous and intermittent  Type of Nipple: Everted at rest and after stimulation  Comfort (Breast/Nipple): Filling, red/small blisters or bruises, mild/mod discomfort  Hold (Positioning): Assistance needed to correctly position infant at breast and maintain latch.  LATCH Score: 8   Lactation Tools Discussed/Used Tools: Shells;Pump;Flanges Flange Size: 21;24 (#21 Flange for today and #24 F when milk comes in) Breast pump type: Manual;Double-Electric Breast Pump Pump Education: Milk Storage;Setup, frequency, and cleaning;Other (comment) (LC reviewed the hand pump) Reason for Pumping: LC recommended when baby is not cluster feeding to post pump both breast for 10 -15 mins  Interventions Interventions: Breast feeding basics reviewed;Assisted with latch;Skin to skin;Breast massage;Hand express;Reverse pressure;Breast compression;Adjust position;Support pillows;Position options;Shells;Hand pump;DEBP;Education  Discharge Discharge Education: Engorgement and breast care;Warning signs for feeding  baby;Outpatient recommendation;Outpatient Epic message sent;Other (comment) (per mom will F/U at the [facility] center - mom aware LC will send a request for F/U LC appt) Pump: Personal;Manual;DEBP  Consult Status Consult Status: Complete Date: 06/13/20 Follow-up type: In-patient    Matilde Sprang Shirlene Andaya 06/13/2020, 9:20 AM

## 2020-06-13 NOTE — Lactation Note (Signed)
This note was copied from a baby's chart. Lactation Consultation Note  Patient Name: Catherine Luna [id number] Date: 06/13/2020 Reason for consult: Follow-up assessment;Primapara;1st time breastfeeding;Early term 37-38.6wks;Infant weight loss;Other (Comment) (6 % weight loss/ Bilicheck 7.7 at 29 hours ( High intermediate). baby asleep and LC recommended and asked mom to call with feeding cues / next feeding for latch assessment.) Age:25 hours  Maternal Data    Feeding Mother's Current Feeding Choice: Breast Milk  LATCH Score                    Lactation Tools Discussed/Used    Interventions    Discharge    Consult Status Consult Status: Follow-up Date: 06/13/20 Follow-up type: In-patient    [name] 06/13/2020, 8:38 AM

## 2020-06-18 ENCOUNTER — Ambulatory Visit: Payer: Medicaid Other

## 2020-06-23 ENCOUNTER — Ambulatory Visit: Payer: Medicaid Other

## 2020-06-23 ENCOUNTER — Other Ambulatory Visit: Payer: Self-pay

## 2020-06-23 VITALS — BP 133/83 | HR 74

## 2020-06-23 DIAGNOSIS — Z0130 Encounter for examination of blood pressure without abnormal findings: Secondary | ICD-10-CM

## 2020-06-23 NOTE — Progress Notes (Signed)
Subjective:  Catherine Luna is a 25 y.o. female here for BP check.   Hypertension ROS: Not taking any medications at this time. No signs of swelling,chest pain,headache or SOB.    Objective:  BP 133/83   Pulse 74   LMP 09/21/2019 (Approximate)   Appearance alert, well appearing, and in no distress. General exam BP noted to be well controlled today in office.    Assessment:   Blood Pressure well controlled.   Plan:  Current treatment plan is effective, no change in therapy.. Follow up at [facility] check.

## 2020-07-10 ENCOUNTER — Other Ambulatory Visit: Payer: Self-pay

## 2020-07-10 ENCOUNTER — Encounter: Payer: Self-pay | Admitting: Advanced Practice Midwife

## 2020-07-10 ENCOUNTER — Ambulatory Visit (INDEPENDENT_AMBULATORY_CARE_PROVIDER_SITE_OTHER): Payer: Medicaid Other | Admitting: Advanced Practice Midwife

## 2020-07-10 DIAGNOSIS — Z9189 Other specified personal risk factors, not elsewhere classified: Secondary | ICD-10-CM | POA: Diagnosis not present

## 2020-07-10 DIAGNOSIS — K5900 Constipation, unspecified: Secondary | ICD-10-CM

## 2020-07-10 DIAGNOSIS — K649 Unspecified hemorrhoids: Secondary | ICD-10-CM | POA: Diagnosis not present

## 2020-07-10 MED ORDER — POLYETHYLENE GLYCOL 3350 17 GM/SCOOP PO POWD: 17.0000 g | Freq: Every day | ORAL | 0 refills | Status: AC

## 2020-07-10 MED ORDER — DOCUSATE SODIUM 100 MG PO CAPS: 100.0000 mg | ORAL_CAPSULE | Freq: Two times a day (BID) | ORAL | 0 refills | Status: AC

## 2020-07-10 NOTE — Progress Notes (Signed)
Post Partum Visit Note  Catherine Luna is a 25 y.o. G87P1011 female who presents for a postpartum visit. She is 4 weeks postpartum following a normal spontaneous vaginal delivery.  I have fully reviewed the prenatal and intrapartum course. The delivery was at 375d gestational weeks.  Anesthesia: none. Postpartum course has been uncomplicated. Baby is doing well. Baby is feeding by breast. Bleeding staining only. Bowel function is abnormal. Bladder function is normal. Patient is not sexually active. Contraception method is none. Postpartum depression screening: negative.   The pregnancy intention screening data noted above was reviewed. Potential methods of contraception were discussed. The patient elected to proceed with No Method - Other Reason.    Edinburgh Postnatal Depression Scale - 07/10/20 0924       Edinburgh Postnatal Depression Scale:  In the Past 7 Days   I have been able to laugh and see the funny side of things. 0    I have looked forward with enjoyment to things. 0    I have blamed myself unnecessarily when things went wrong. 0    I have been anxious or worried for no good reason. 2    I have felt scared or panicky for no good reason. 0    Things have been getting on top of me. 2    I have been so unhappy that I have had difficulty sleeping. 0    I have felt sad or miserable. 2    I have been so unhappy that I have been crying. 2    The thought of harming myself has occurred to me. 0    Edinburgh Postnatal Depression Scale Total 8             Health Maintenance Due  Topic Date Due   COVID-19 Vaccine (1) Never done   HPV VACCINES (1 - 2-dose series) Never done    The following portions of the patient's history were reviewed and updated as appropriate: allergies, current medications, past family history, past medical history, past social history, past surgical history, and problem list.  Review of Systems Pertinent items noted in HPI and remainder of  comprehensive ROS otherwise negative.  Objective:  BP 111/78   Pulse (!) 57   Ht 5\' 6"  (1.676 m)   Wt 132 lb 12.8 oz (60.2 kg)   LMP 09/21/2019 (Approximate)   Breastfeeding Yes   BMI 21.43 kg/m    VS reviewed, nursing note reviewed,  Constitutional: well developed, well nourished, no distress HEENT: normocephalic CV: normal rate Pulm/chest wall: normal effort Abdomen: soft Neuro: alert and oriented x 3 Skin: warm, dry Psych: affect normal       Assessment:   1. Postpartum examination following vaginal delivery --Overall doing well, reports some increase in anxiety, difficulty leaving the house and breastfeeding in public. Discussed ways to   2. At risk for postpartum depression --Pt doing well, if feelings of anxiety do not improve with sleep/time, consider counseling and /or medications --F/U mood check in 2-4 weeks  3. Constipation, unspecified constipation type --Increase PO fluids and fiber intake  - docusate sodium (COLACE) 100 MG capsule; Take 1 capsule (100 mg total) by mouth 2 (two) times daily.  Dispense: 10 capsule; Refill: 0 - polyethylene glycol powder (GLYCOLAX/MIRALAX) 17 GM/SCOOP powder; Take 17 g by mouth daily.  Dispense: 255 g; Refill: 0   4. Hemorrhoids, unspecified hemorrhoid type --Treat constipation, f/u if hemorrhoids not improved   Plan:   Essential components of care per ACOG  recommendations:  1.  Mood and well being: Patient with negative depression screening today. Reviewed local resources for support.  - Patient tobacco use? Yes. Patient desires to quit? Yes.Discussed reduction and cessation  - hx of drug use? No.    2. Infant care and feeding:  -Patient currently breastmilk feeding? Yes. Discussed returning to work and pumping. Reviewed importance of draining breast regularly to support lactation.  -Social determinants of health (SDOH) reviewed in EPIC. No concerns  3. Sexuality, contraception and birth spacing - Patient does not want  a pregnancy in the next year.   - Reviewed forms of contraception in tiered fashion. Patient desired no method, abstinence today.  She is not currently sexually active. She would consider POPs/OCPs in the future as needed. - Discussed birth spacing of 18 months  4. Sleep and fatigue -Encouraged family/partner/community support of 4 hrs of uninterrupted sleep to help with mood and fatigue  5. Physical Recovery  - Discussed patients delivery and complications. She describes her labor as good. - Patient had a Vaginal, no problems at delivery. Patient had a  right labial  laceration. Perineal healing reviewed. Patient expressed understanding - Patient has urinary incontinence? No. - Patient is safe to resume physical and sexual activity  6.  Health Maintenance - HM due items addressed Yes - Last pap smear  Diagnosis  Date Value Ref Range Status  12/27/2019 (A)  Final   - Atypical squamous cells of undetermined significance (ASC-US)  12/27/2019 - See comment (A)  Final   Pap smear not done at today's visit.  -Breast Cancer screening indicated? No.   7. Chronic Disease/Pregnancy Condition follow up: None  - PCP follow up  Sharen Counter, CNM [facility] [employer], [facility]

## 2020-07-10 NOTE — Patient Instructions (Signed)
Postpartum Baby Blues The postpartum period begins right after the birth of a baby. During this time, there is often joy and excitement. It is also a time of many changes in the life of the parents. A mother may feel happy one minute and sad or stressed the next. These feelings of sadness, called the baby blues, usually happen in theperiod right after the baby is born and go away within a week or two. What are the causes? The exact cause of this condition is not known. Changes in hormone levels afterchildbirth are believed to trigger some of the symptoms. Other factors that can play a role in these mood changes include: Lack of sleep. Stressful life events, such as financial problems, caring for a loved one, or death of a loved one. Genetics. What are the signs or symptoms? Symptoms of this condition include: Changes in mood, such as going from extreme happiness to sadness. A decrease in concentration. Difficulty sleeping. Crying spells and tearfulness. Loss of appetite. Irritability. Anxiety. If these symptoms last for more than 2 weeks or become more severe, you mayhave postpartum depression. How is this diagnosed? This condition is diagnosed based on an evaluation of your symptoms. Your health care provider may use a screening tool that includes a list of questionsto help identify a person with the baby blues or postpartum depression. How is this treated? The baby blues usually go away on their own in 1-2 weeks. Social support isoften what is needed. You will be encouraged to get adequate sleep and rest. Follow these instructions at home: Lifestyle     Get as much rest as you can. Take a nap when the baby sleeps. Exercise regularly as told by your health care provider. Some women find yoga and walking to be helpful. Eat a balanced and nourishing diet. This includes plenty of fruits and vegetables, whole grains, and lean proteins. Do little things that you enjoy. Take a bubble bath,  read your favorite magazine, or listen to your favorite music. Avoid alcohol. Ask for help with household chores, cooking, grocery shopping, or running errands. Do not try to do everything yourself. Consider hiring a postpartum doula to help. This is a professional who specializes in providing support to new mothers. Try not to make any major life changes during pregnancy or right after giving birth. This can add stress. General instructions Talk to people close to you about how you are feeling. Get support from your partner, family members, friends, or other new moms. You may want to join a support group. Find ways to manage stress. This may include: Writing your thoughts and feelings in a journal. Spending time outside. Spending time with people who make you laugh. Try to stay positive in how you think. Think about the things you are grateful for. Take over-the-counter and prescription medicines only as told by your health care provider. Let your health care provider know if you have any concerns. Keep all postpartum visits. This is important. Contact a health care provider if: Your baby blues do not go away after 2 weeks. Get help right away if: You have thoughts of taking your own life (suicidal thoughts), or of harming your baby or someone else. You see or hear things that are not there (hallucinations). If you ever feel like you may hurt yourself or others, or have thoughts about taking your own life, get help right away. Go to your nearest emergency department or: Call your local emergency services (911 in the [place] Call a   suicide crisis helpline, such as the National Suicide Prevention Lifeline, at 1-800-273-8255. This is open 24 hours a day in the [place] Text the Crisis Text Line at 741741 (in the [place] Summary After giving birth, you may feel happy one minute and sad or stressed the next. Feelings of sadness that happen right after the baby is born and go away after a week or two  are called the baby blues. You can manage the baby blues by getting enough rest, eating a healthy diet, exercising, spending time with supportive people, and finding ways to manage stress. If feelings of sadness and stress last longer than 2 weeks or get in the way of caring for your baby, talk with your health care provider. This may mean you have postpartum depression. This information is not intended to replace advice given to you by your health care provider. Make sure you discuss any questions you have with your healthcare provider. Document Revised: 06/15/2019 Document Reviewed: 06/15/2019 Elsevier Patient Education  2022 Elsevier Inc.  

## 2020-07-28 ENCOUNTER — Encounter: Payer: Self-pay | Admitting: Advanced Practice Midwife

## 2020-07-28 ENCOUNTER — Telehealth (INDEPENDENT_AMBULATORY_CARE_PROVIDER_SITE_OTHER): Payer: Medicaid Other | Admitting: Advanced Practice Midwife

## 2020-07-28 DIAGNOSIS — F418 Other specified anxiety disorders: Secondary | ICD-10-CM | POA: Diagnosis not present

## 2020-07-28 DIAGNOSIS — Z9189 Other specified personal risk factors, not elsewhere classified: Secondary | ICD-10-CM

## 2020-07-28 DIAGNOSIS — O99345 Other mental disorders complicating the puerperium: Secondary | ICD-10-CM | POA: Diagnosis not present

## 2020-07-28 NOTE — Progress Notes (Signed)
    GYNECOLOGY VIRTUAL VISIT ENCOUNTER NOTE  Provider location: [facility] for [facility]   Patient location: Home  I connected with Catherine Luna on 07/28/20 at  1:20 PM EDT by MyChart Video Encounter and verified that I am speaking with the correct person using two identifiers.   I discussed the limitations, risks, security and privacy concerns of performing an evaluation and management service virtually and the availability of in person appointments. I also discussed with the patient that there may be a patient responsible charge related to this service. The patient expressed understanding and agreed to proceed.   History:  Catherine Luna is a 25 y.o. G75P1011 female being evaluated today for postpartum mood check. She had NSVD on 06/12/20 and reported some anxiety with lack of sleep at 4 weeks postpartum on 07/10/20. Mood check was scheduled today as virtual visit.  Pt still reports stress and some anxiety, it is not worsening, and has improved some but is still present.  She is interested in following up with counseling. She denies any risks of harming herself or others.  She is bonding well with baby and sleeping more hours consecutively now than at 4 weeks PP.     Past Medical History:  Diagnosis Date   Seizures (HCC)    as a child   No past surgical history on file. The following portions of the patient's history were reviewed and updated as appropriate: allergies, current medications, past family history, past medical history, past social history, past surgical history and problem list.   Health Maintenance:  ASCUS with hpv 12/2019, repeat Pap 12/2020 recommended.  Review of Systems:  Pertinent items noted in HPI and remainder of comprehensive ROS otherwise negative.  Physical Exam:   General:  Alert, oriented and cooperative. Patient appears to be in no acute distress.  Mental Status: Normal mood and affect. Normal behavior. Normal judgment and thought  content.   Respiratory: Normal respiratory effort, no problems with respiration noted  Rest of physical exam deferred due to type of encounter  Labs and Imaging No results found for this or any previous visit (from the past 336 hour(s)). No results found.     Assessment and Plan:     1. Postpartum anxiety -Improved but still present, pt would benefit from counseling x1, further follow as needed. - Ambulatory referral to Integrated Behavioral Health  2. At risk for postpartum depression  - Ambulatory referral to Integrated Behavioral Health       I discussed the assessment and treatment plan with the patient. The patient was provided an opportunity to ask questions and all were answered. The patient agreed with the plan and demonstrated an understanding of the instructions.   The patient was advised to call back or seek an in-person evaluation/go to the ED if the symptoms worsen or if the condition fails to improve as anticipated.  I provided 5 minutes of face-to-face time during this encounter.   Sharen Counter, CNM [facility] [employer], [facility]

## 2020-08-04 ENCOUNTER — Telehealth: Payer: Self-pay | Admitting: Licensed Clinical Social Worker

## 2020-08-04 ENCOUNTER — Encounter: Payer: Medicaid Other | Admitting: Licensed Clinical Social Worker

## 2020-08-04 NOTE — Telephone Encounter (Signed)
Called pt regarding scheduled mychart visit. Unable to leave voicemail

## 2021-10-14 IMAGING — US US MFM FETAL BPP W/ NON-STRESS
1 series · 14 of 28 positions shown · non-contrast
Comparison: none

[Series 1: us mfm fetal bpp w/ non-stress · 76 acquisitions, 14 frames shown]
[im 3/76]
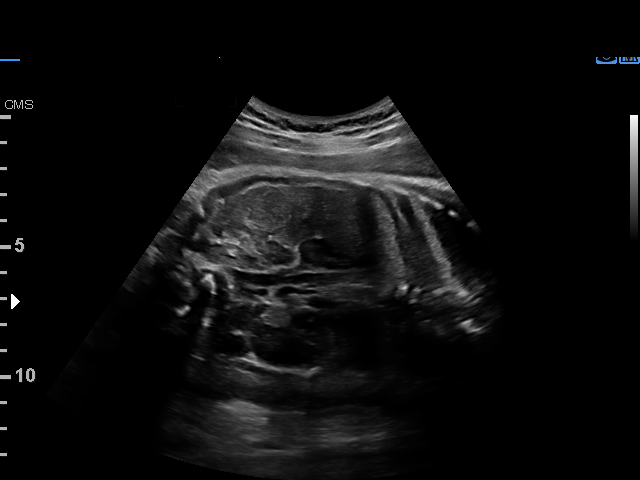
[im 9/76]
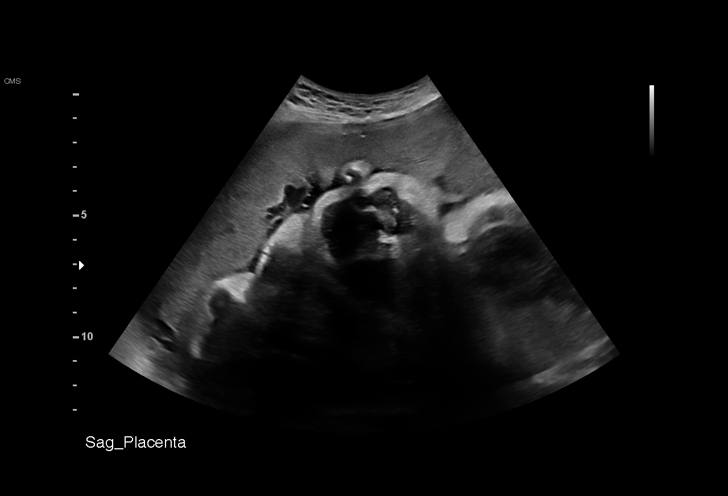
[im 14/76]
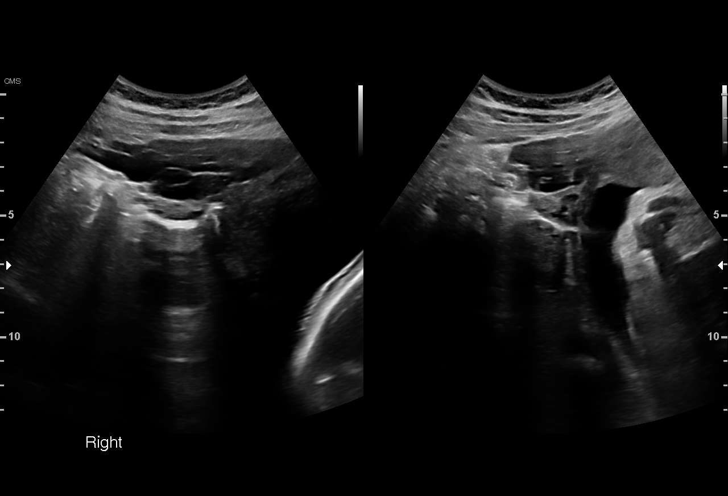
[im 20/76]
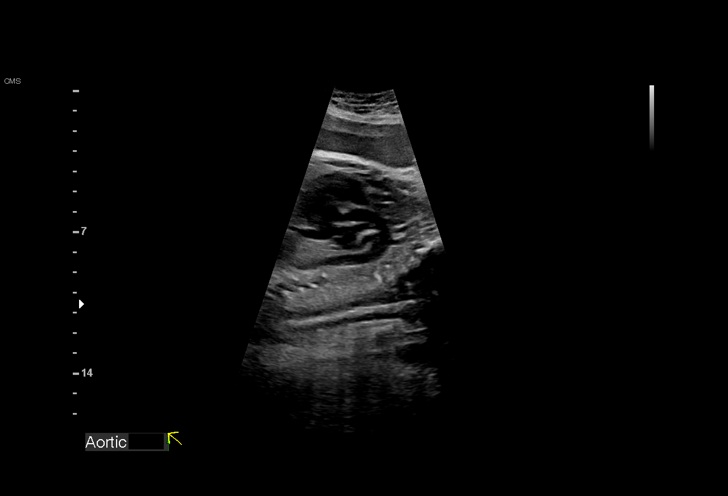
[im 26/76]
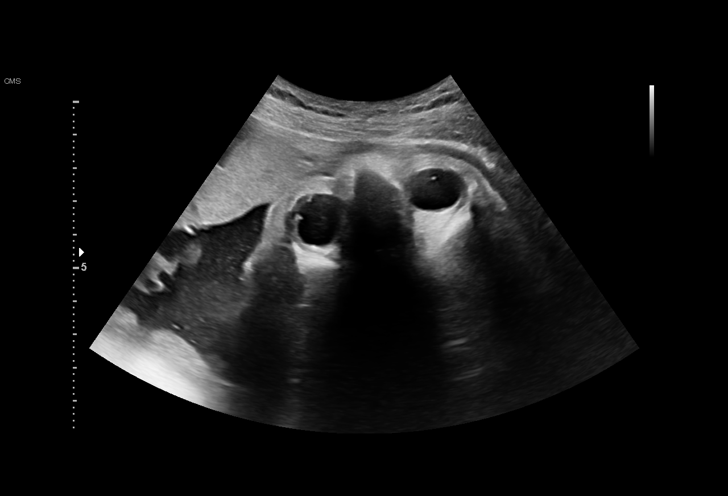
[im 31/76]
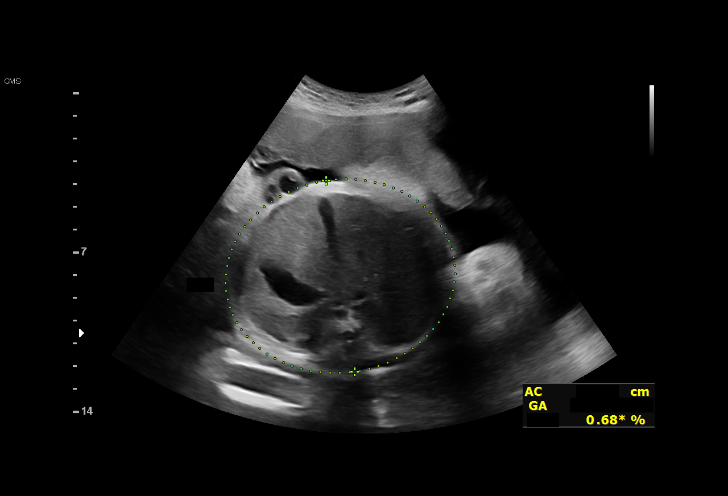
[im 37/76]
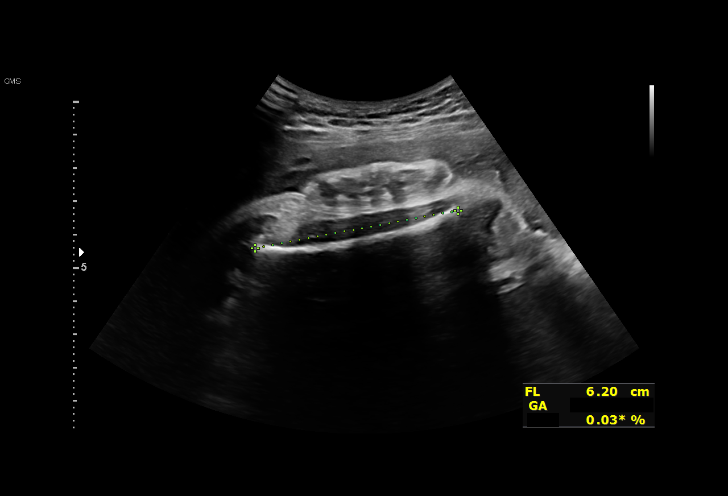
[im 42/76]
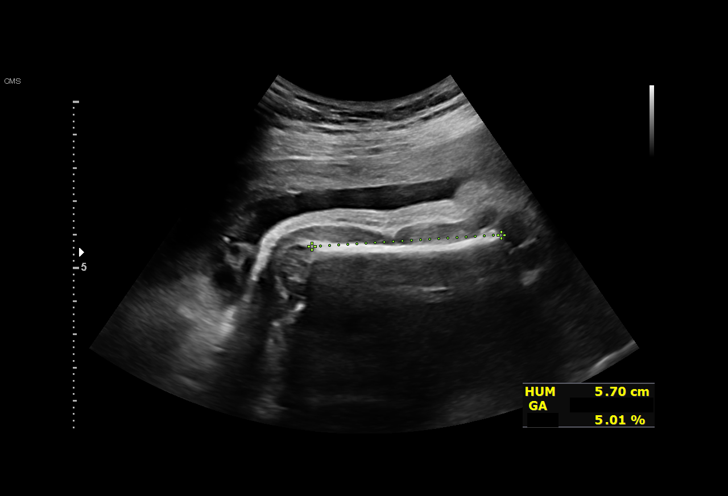
[im 48/76]
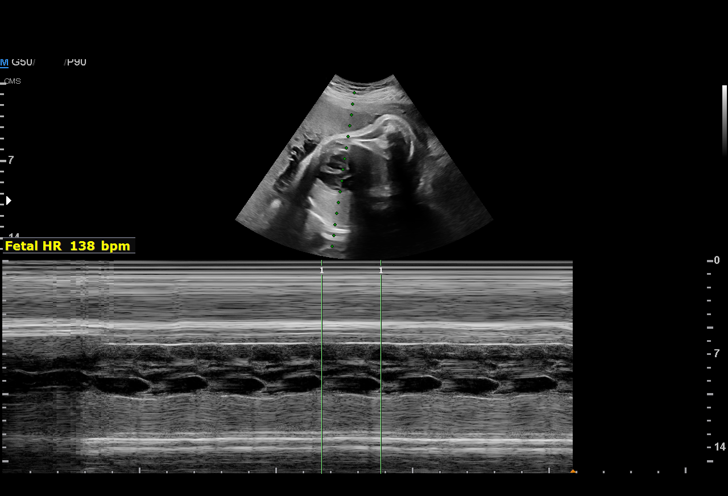
[im 53/76]
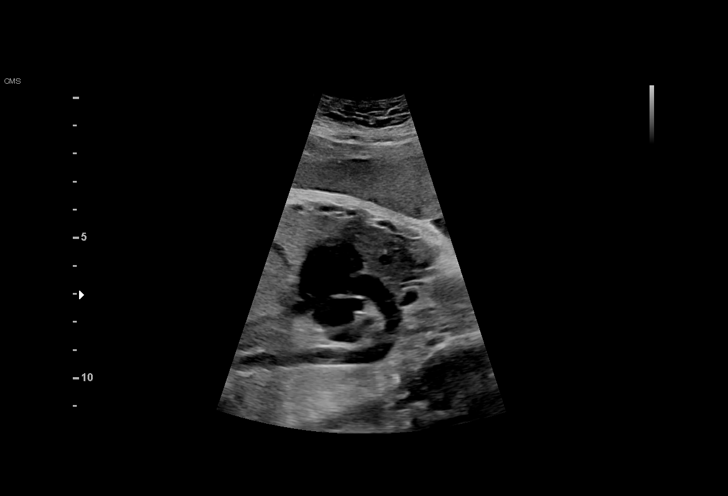
[im 59/76]
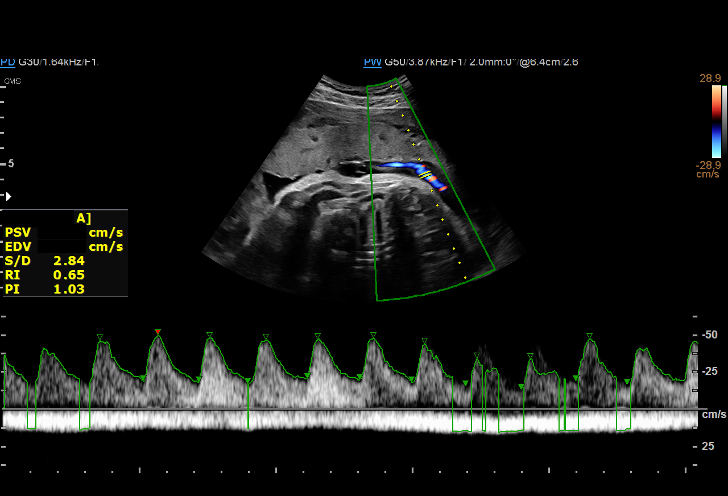
[im 64/76]
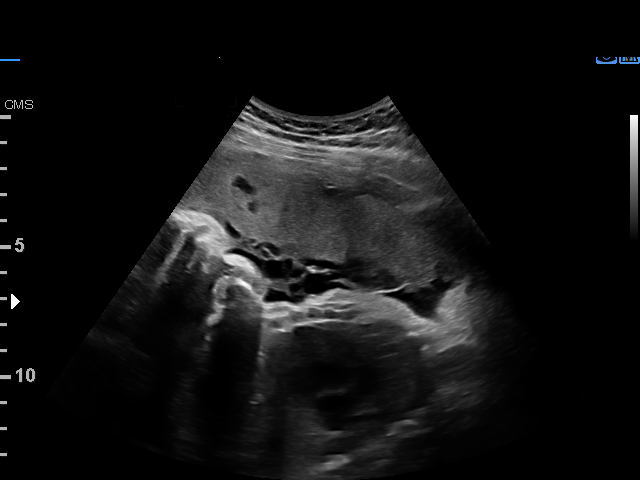
[im 70/76]
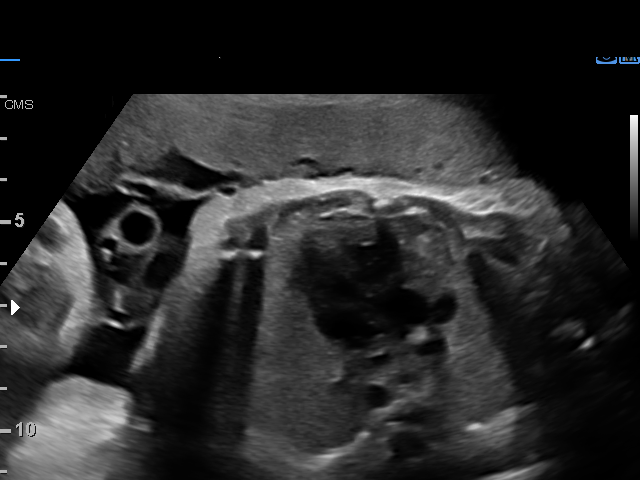
[im 76/76]
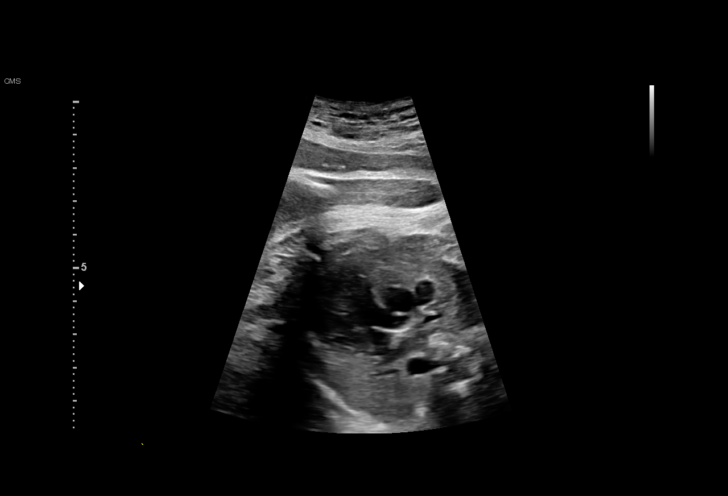

[14 of 28 positions shown; findings below may reference images not displayed]

W/NONSTRESS

Indications

 37 weeks gestation of pregnancy
 Maternal care for known or suspected poor
 fetal growth, third trimester, not applicable or
 unspecified IUGR
 Tobacco use complicating pregnancy
 (Vaping)
 Low Risk NIPS(Negative AFP)(Negative
 Horizon [DATE])
Fetal Evaluation

 Num Of Fetuses:         1
 Fetal Heart Rate(bpm):  138
 Cardiac Activity:       Observed
 Presentation:           Cephalic
 Placenta:               Anterior
 P. Cord Insertion:      Previously Visualized

 Amniotic Fluid
 AFI FV:      Within normal limits

 AFI Sum(cm)     %Tile       Largest Pocket(cm)
 10.8            30
 RUQ(cm)       RLQ(cm)       LUQ(cm)        LLQ(cm)

Biometry

 BPD:        88  mm     G. Age:  35w 4d         25  %    CI:        74.34   %    70 - 86
                                                         FL/HC:      19.8   %    20.8 -
 HC:       324   mm     G. Age:  36w 5d         18  %    HC/AC:      1.10        0.92 -
 AC:      294.3  mm     G. Age:  33w 3d        < 1  %    FL/BPD:     73.0   %    71 - 87
 FL:       64.2  mm     G. Age:  33w 1d        < 1  %    FL/AC:      21.8   %    20 - 24
 HUM:      56.9  mm     G. Age:  33w 0d        < 5  %
 Est. FW:    7726  gm      5 lb 1 oz    2.8  %
OB History

 Gravidity:    2
 TOP:          1        Living:  0
Gestational Age

 LMP:           37w 0d        Date:  09/21/19                 EDD:   06/27/20
 U/S Today:     34w 5d                                        EDD:   07/13/20
 Best:          37w 0d     Det. By:  LMP  (09/21/19)          EDD:   06/27/20
Anatomy

 Cranium:               Appears normal         LVOT:                   Appears normal
 Cavum:                 Appears normal         Aortic Arch:            Appears normal
 Ventricles:            Appears normal         Ductal Arch:            Previously seen
 Choroid Plexus:        Previously seen        Diaphragm:              Previously seen
 Cerebellum:            Previously seen        Stomach:                Appears normal, left
                                                                       sided
 Posterior Fossa:       Previously seen        Abdomen:                Previously seen
 Nuchal Fold:           Previously seen        Abdominal Wall:         Previously seen
 Face:                  Appears normal         Cord Vessels:           Previously seen
                        (orbits and profile)
 Lips:                  Previously seen        Kidneys:                Appear normal
 Palate:                Appears normal         Bladder:                Appears normal
 Thoracic:              Previously seen        Spine:                  Previously seen
 Heart:                 Appears normal         Upper Extremities:      Previously seen
                        (4CH, axis, and
                        situs)
 RVOT:                  Appears normal         Lower Extremities:      Previously seen

 Other:  Female gender previously seen. VC, 3VV and 3VTV previously
         visualized.
Doppler - Fetal Vessels

 Umbilical Artery
  S/D     %tile      RI    %tile      PI    %tile            ADFV    RDFV
  2.66       70    0.62       75    0.99       83               No      No

Impression

 Fetal growth restriction.  On ultrasound performed 3 weeks
 ago, the estimated fetal weight was at the 7th percentile.
 Blood pressure today at her office is 130/63 mmHg.
 On today's ultrasound, the estimated fetal weight is at the 3rd
 percentile.  Abdominal circumference measurement is at the
 1st percentile.  Amniotic fluid is normal and good fetal activity
 seen.  Umbilical artery Doppler showed normal forward
 diastolic flow.  NST is reactive.  BPP [DATE].
 I explained the finding of fetal growth restriction discussed
 timing of delivery.  Severe fetal growth restriction carries
 higher perinatal morbidity and mortality and I recommended
 delivery now.
 Patient would like to wait till next week when her induction
 has been scheduled.
                 Minnick, Pramod
